# Patient Record
Sex: Female | Born: 1975 | Race: White | Hispanic: No | Smoking: Current every day smoker
Health system: Southern US, Academic
[De-identification: ages and names within clinical notes are randomized; demographics above are authoritative.]

## PROBLEM LIST (undated history)

## (undated) DIAGNOSIS — G43909 Migraine, unspecified, not intractable, without status migrainosus: Secondary | ICD-10-CM

## (undated) DIAGNOSIS — G4700 Insomnia, unspecified: Secondary | ICD-10-CM

## (undated) DIAGNOSIS — K219 Gastro-esophageal reflux disease without esophagitis: Secondary | ICD-10-CM

## (undated) HISTORY — DX: Insomnia, unspecified: G47.00

## (undated) HISTORY — DX: Gastro-esophageal reflux disease without esophagitis: K21.9

## (undated) HISTORY — PX: HX TONSILLECTOMY: SHX27

## (undated) HISTORY — DX: Migraine, unspecified, not intractable, without status migrainosus: G43.909

---
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
## 2016-04-22 DIAGNOSIS — C73 Malignant neoplasm of thyroid gland: Secondary | ICD-10-CM

## 2016-04-22 HISTORY — DX: Malignant neoplasm of thyroid gland: C73

## 2016-04-22 HISTORY — PX: HX PARTIAL THYROIDECTOMY: SHX18

## 2017-04-22 HISTORY — PX: TOTAL THYROIDECTOMY: SHX2547

## 2018-04-22 HISTORY — PX: HX PARTIAL THYROIDECTOMY: SHX18

## 2020-05-10 IMAGING — MR MRI LUMBAR SPINE WITHOUT CONTRAST
6 series · 48 of 48 positions shown · IV contrast (gadolinium)
Comparison: None available.

﻿EXAM:  MRI LUMBAR SPINE WITHOUT CONTRAST
INDICATION: Lower back pain.
TECHNIQUE: Multiplanar multisequential MRI of the lumbar spine was performed without gadolinium contrast. A non conventional body coil was utilized due to patient's extremely large body habitus.

[Series 4: T2 · sagittal · 5.0mm · 1.00mm/px · 6 of 13 slices shown (1 of 3)]
[im 1/13]
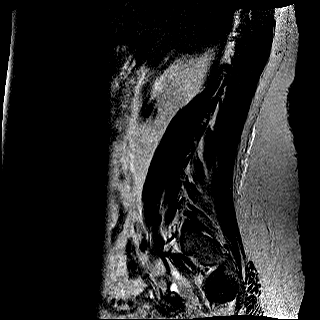
[im 3/13]
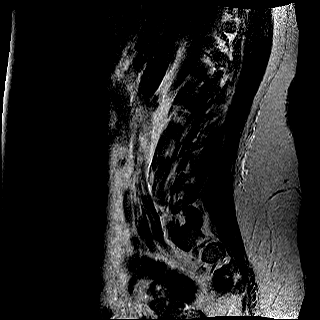
[im 5/13]
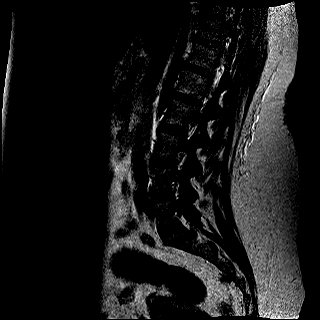
[im 8/13]
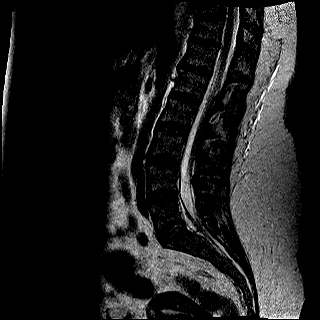
[im 10/13]
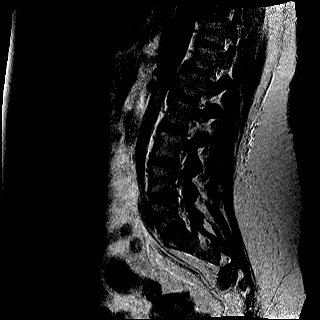
[im 13/13]
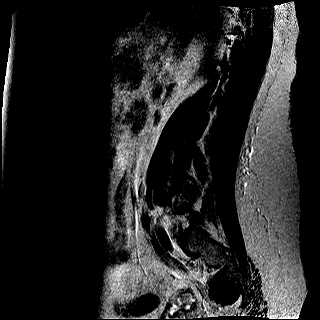

[Series 5: T1 · sagittal · 5.0mm · 1.00mm/px · 5 of 13 slices shown (1 of 2)]
[im 1/13]
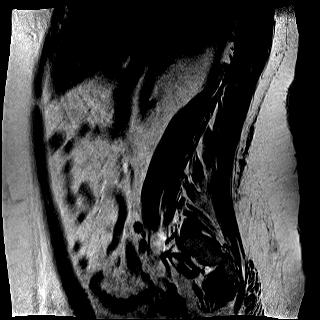
[im 4/13]
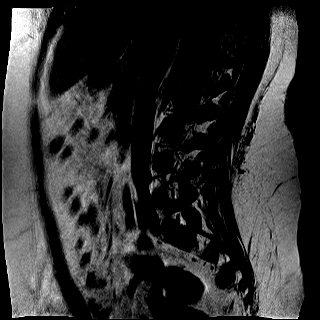
[im 7/13]
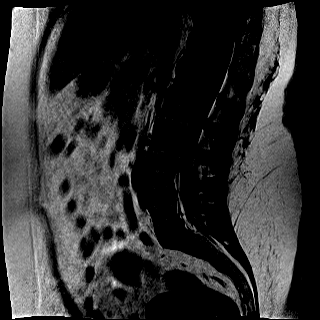
[im 10/13]
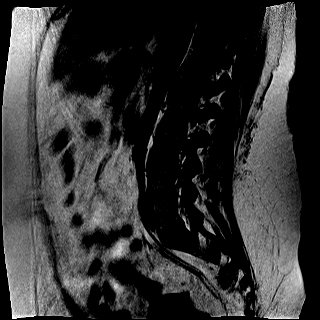
[im 13/13]
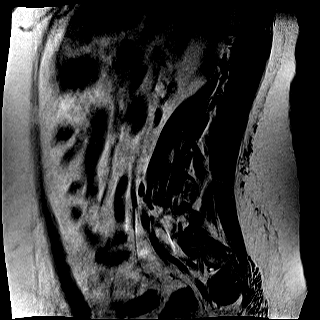

[Series 6: STIR · sagittal · 5.0mm · 1.25mm/px · 5 of 13 slices shown]
[im 1/13]
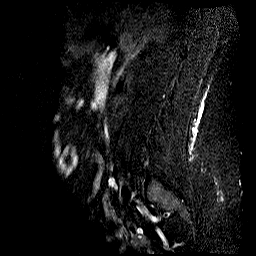
[im 4/13]
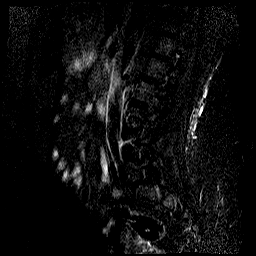
[im 7/13]
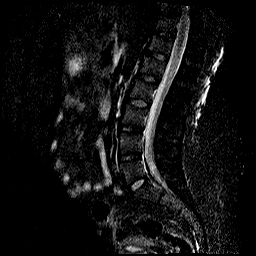
[im 10/13]
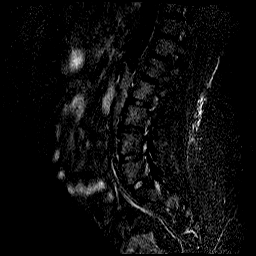
[im 13/13]
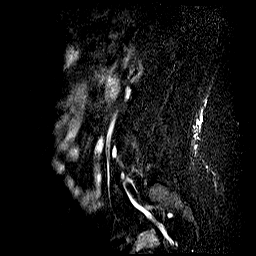

[Series 7: T2 · coronal · 4.0mm · 1.34mm/px · 8 of 20 slices shown (2 of 3)]
[im 1/20]
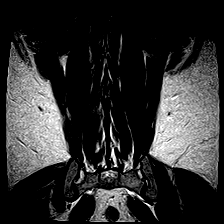
[im 3/20]
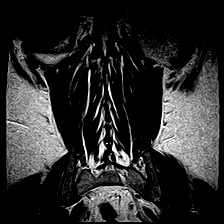
[im 6/20]
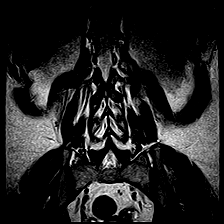
[im 9/20]
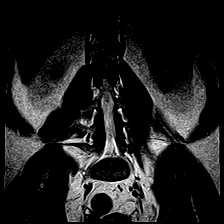
[im 11/20]
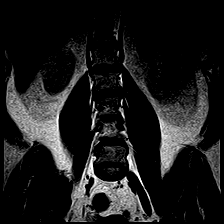
[im 14/20]
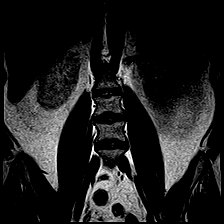
[im 17/20]
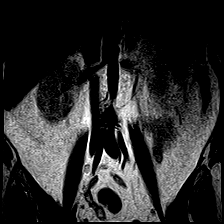
[im 20/20]
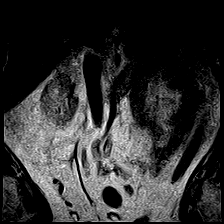

[Series 8: T2 · axial · 5.0mm · 0.89mm/px · z∈[-124,+133]mm · 12 of 30 slices shown (3 of 3)]
[im 1/30]
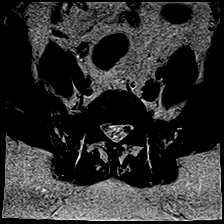
[im 3/30]
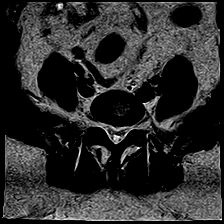
[im 6/30]
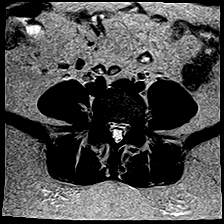
[im 8/30]
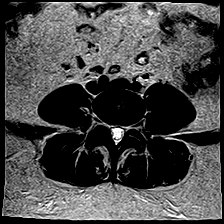
[im 11/30]
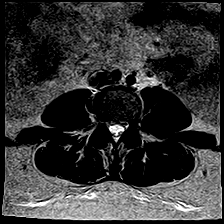
[im 14/30]
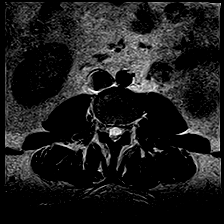
[im 16/30]
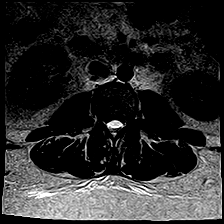
[im 19/30]
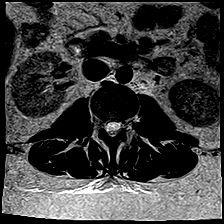
[im 22/30]
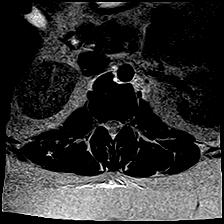
[im 24/30]
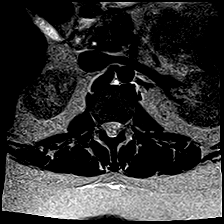
[im 27/30]
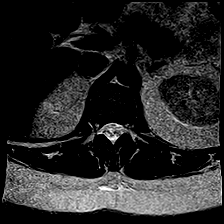
[im 30/30]
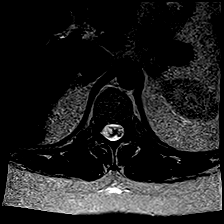

[Series 9: T1 · axial · 5.0mm · 0.89mm/px · z∈[-124,+133]mm · 12 of 30 slices shown (2 of 2)]
[im 1/30]
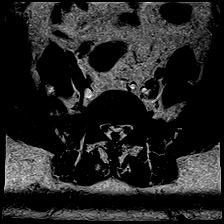
[im 3/30]
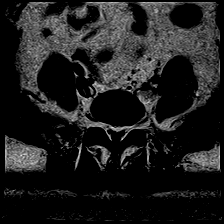
[im 6/30]
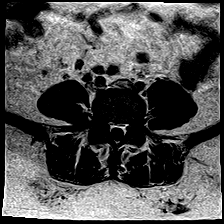
[im 8/30]
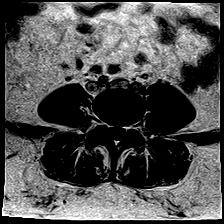
[im 11/30]
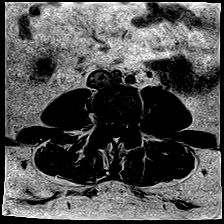
[im 14/30]
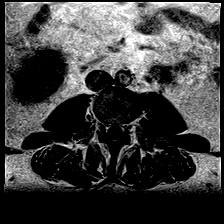
[im 16/30]
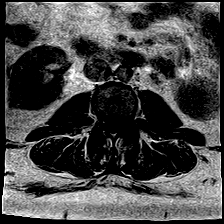
[im 19/30]
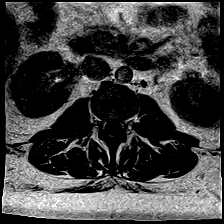
[im 22/30]
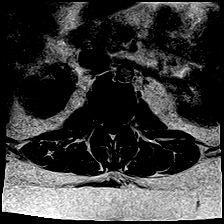
[im 24/30]
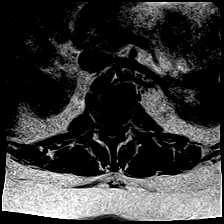
[im 27/30]
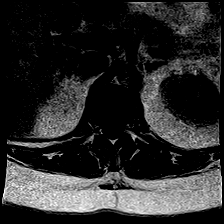
[im 30/30]
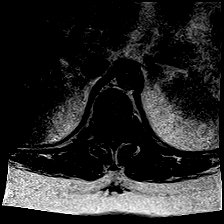

[48 of 48 positions shown; findings below may reference images not displayed]

FINDINGS: Vertebral bodies are normal in height, alignment and signal intensity. There is no acute fracture or subluxation. Distal spinal cord is also normal in signal intensity and terminates normally at T11-12 disc space level. Spinal canal is congenitally narrow.

There is no significant disc herniation, spinal canal or neural foraminal stenosis at any level.

Paraspinal soft tissues are also unremarkable.
IMPRESSION: Essentially unremarkable exam.

## 2020-10-30 IMAGING — MG 3D SCREENING MAMMO BIL W/CAD & TOMO
5 series · 8 of 15 positions shown · non-contrast
Comparison: None.

------------- REPORT [id number] -------------
﻿

TIGER, MUAYTHAI
DAYLEN AUNG,PA
EXAM:  3D BILATERAL ANNUAL SCREENING DIGITAL MAMMOGRAM WITH CAD AND TOMOSYNTHESIS
INDICATION: Screening.

[R]
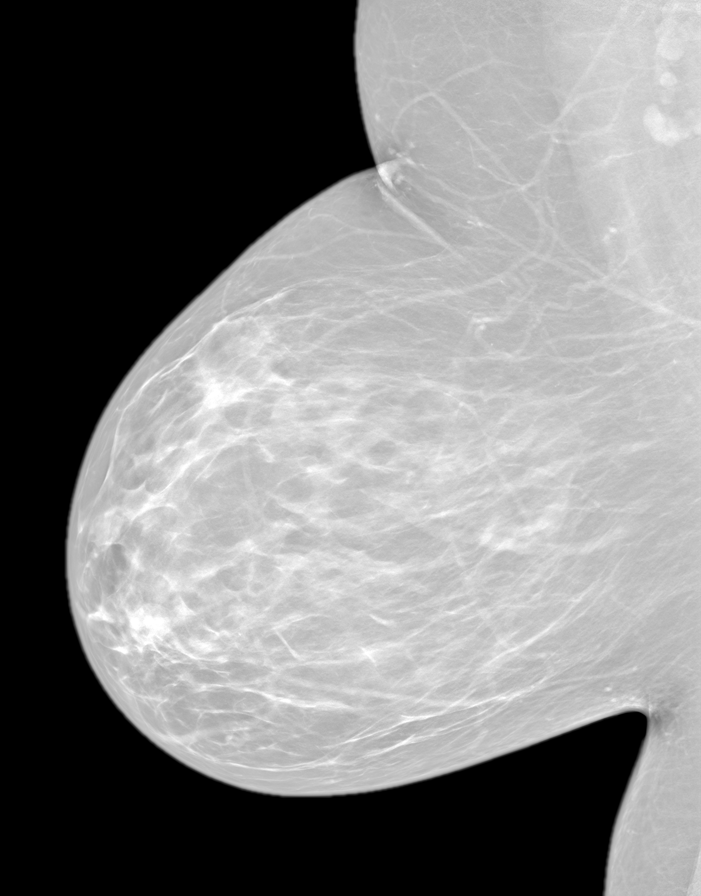

[L]
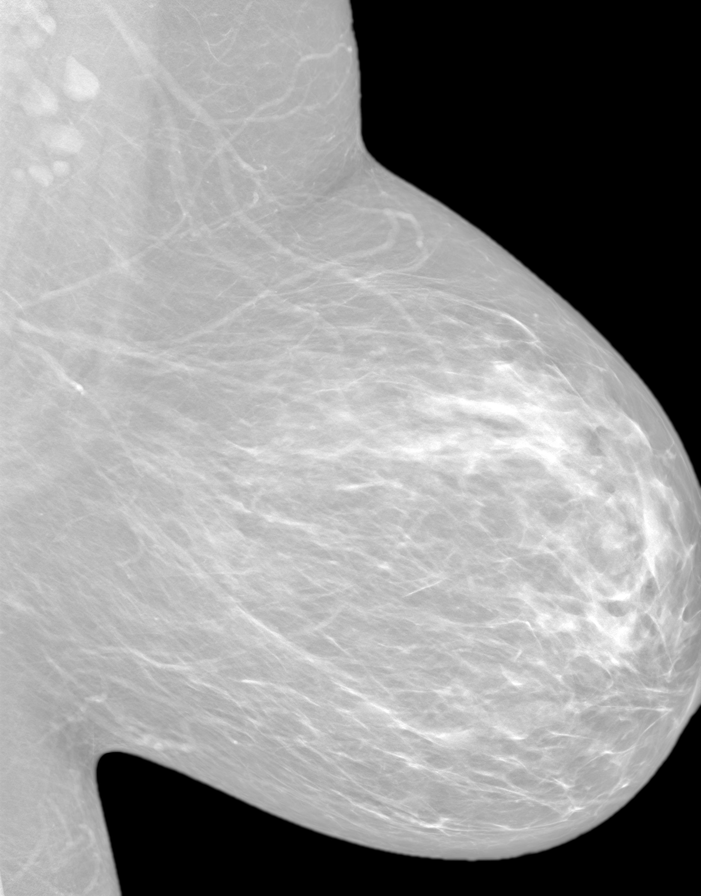

[R CC tomo · right · 0.10mm/px · 3 of 3 slices shown]
[im 1/3]
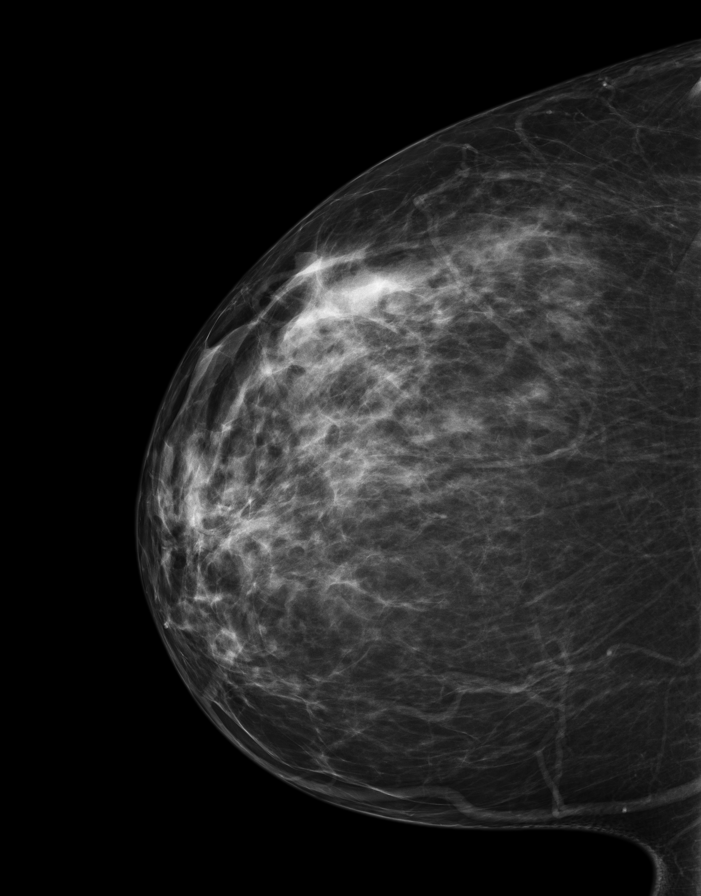
[im 2/3]
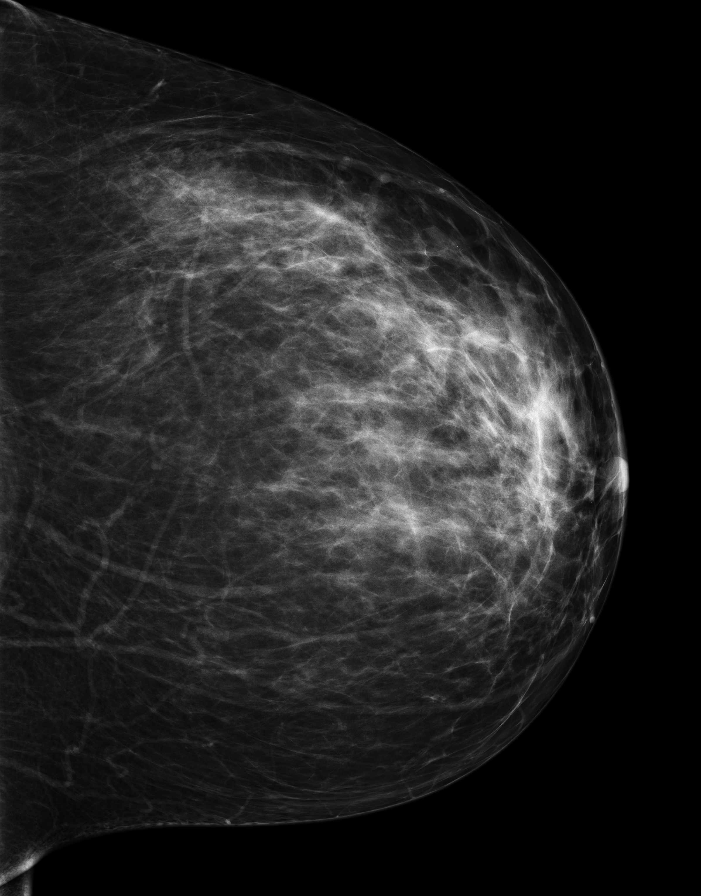
[im 3/3]
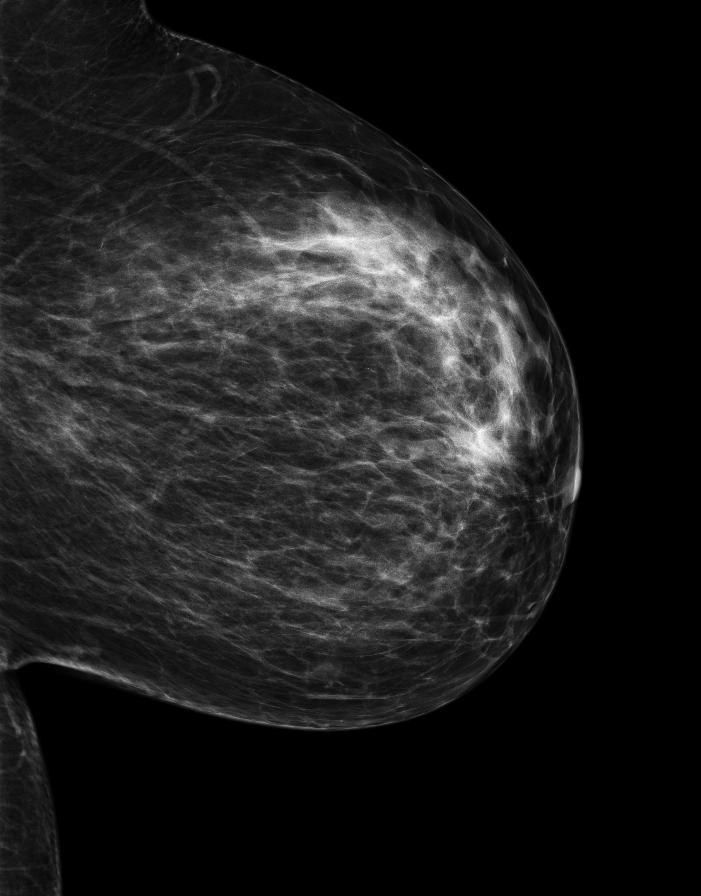

[3D SCREENING MAMMO BIL W/CAD & TOMO tomo · 2 of 17 frames shown (1 of 2)]
[frame 6/17]
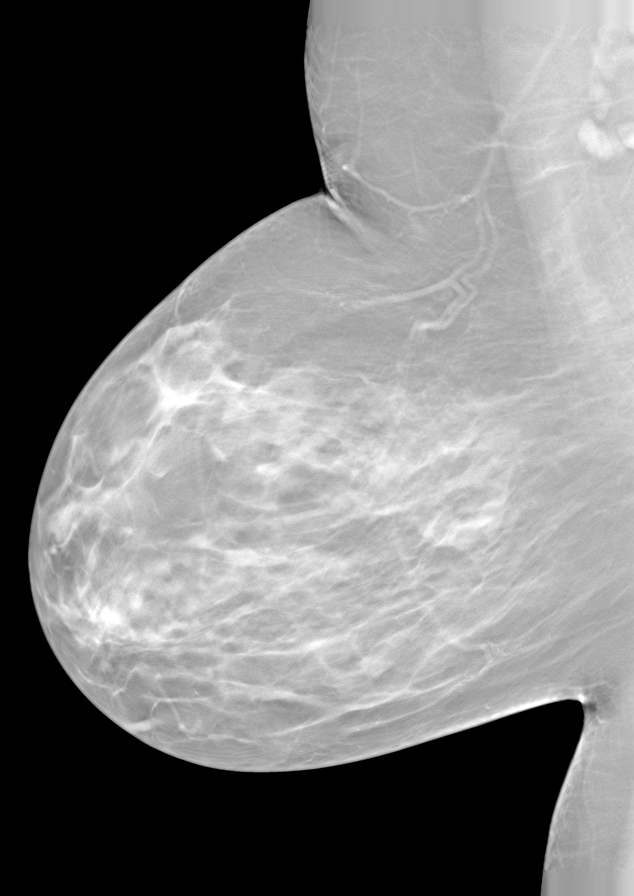
[frame 9/17]
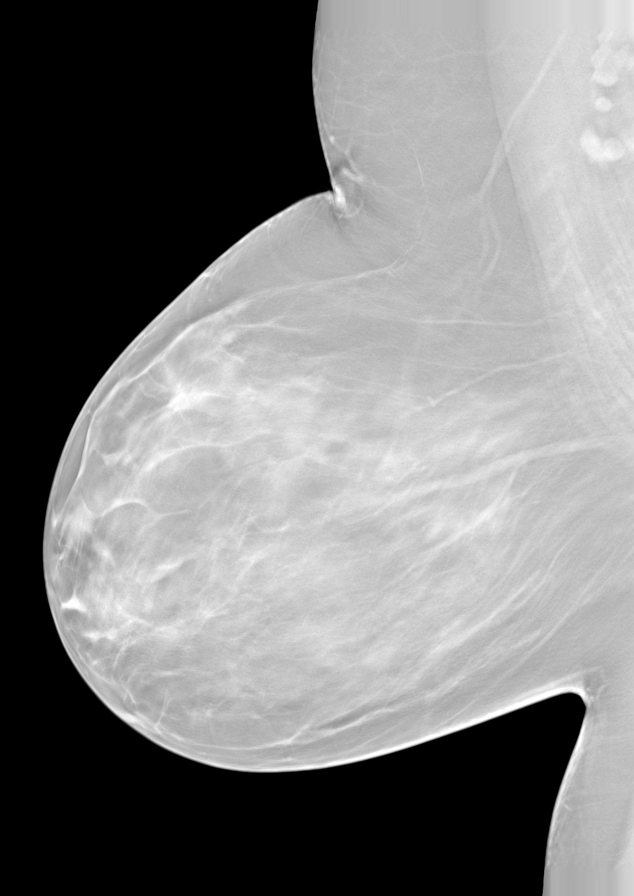

[3D SCREENING MAMMO BIL W/CAD & TOMO tomo (2 of 2) · tomo slice 11/20.0]
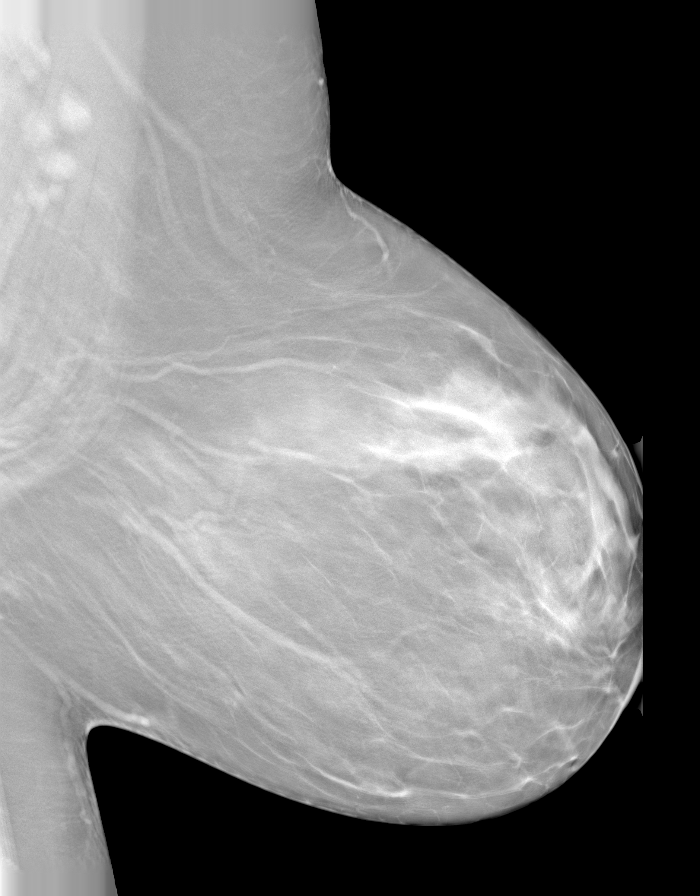

[8 of 15 positions shown; findings below may reference images not displayed]

FINDINGS: Breast parenchyma is heterogeneously dense.  There is no mass or suspicious cluster of microcalcifications.  There is no architectural distortion, skin thickening or nipple retraction.
IMPRESSION: 1.  BIRADS 0-Need additional imaging evaluation.  A comprehensive bilateral breast ultrasound is recommended due to heterogeneously dense breast parenchyma and lack of previous comparison mammograms. 

2.  DENSITY CODE – C (Heterogeneously dense). 

Final Assessment Code:

Bi-Rads 0

BI-RADS 0
 Need additional imaging evaluation.

BI-RADS 1
 Negative mammogram.

BI-RADS 2
 Benign finding.

BI-RADS 3
 Probably benign finding; short-interval follow-up suggested.

BI-RADS 4
 Suspicious abnormality; biopsy should be considered.

BI-RADS 5
 Highly suggestive of malignancy; appropriate action should be taken.

BI-RADS 6
 Known biopsy-proven malignancy; appropriate action should be taken.

NOTE:
In compliance with Federal regulations, the results of this mammogram are being sent to the patient.

------------- REPORT [id number] -------------
[facility]
[address]
[place] Ms.VALLADAREZ, YEIMY:
We wish to report the following on your recent mammography examination. We are sending a report to your referring physician or other health care provider. 
(       Abnormal: There is a finding on your mammogram that requires further tests for a more thorough evaluation. You should contact your referring physician as soon as possible (if you have not already done so).
This statement is mandated by the [place], Department of Health.
Your examination was performed by one of our technologists, who are registered radiological technologists and also specially certified in mammography:
___
Fatah, Abdalfatah (M)

Your mammogram was interpreted by our radiologist.

( 
Valentyna Groover, M.D.

(Annual Breast Examination by a physician or other health care provider
(Annual Mammography Screening beginning at age 40
(Monthly Breast Self Examination

## 2020-11-20 IMAGING — US US RT BREAST COMPLETE- 4 QUADRANTS PLUS AXILLA
1 series · 14 of 25 positions shown · non-contrast
Comparison: Mammogram dated 10/30/2020.

﻿EXAM:  26648   US RT BREAST COMPLETE- 4 QUADRANTS PLUS AXILLA,US LT BREAST COMPLETE- 4 QUADRANTS PLUS AXILLA
INDICATION: Call back for dense breast parenchyma.

[Series 1: us right breast complete- 4 quadrants plus axilla · 14 of 58 slices shown]
[im 1/58]
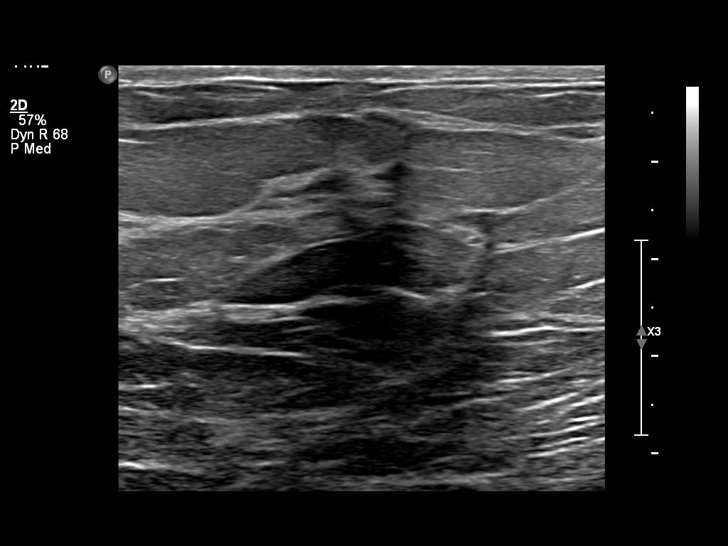
[im 5/58]
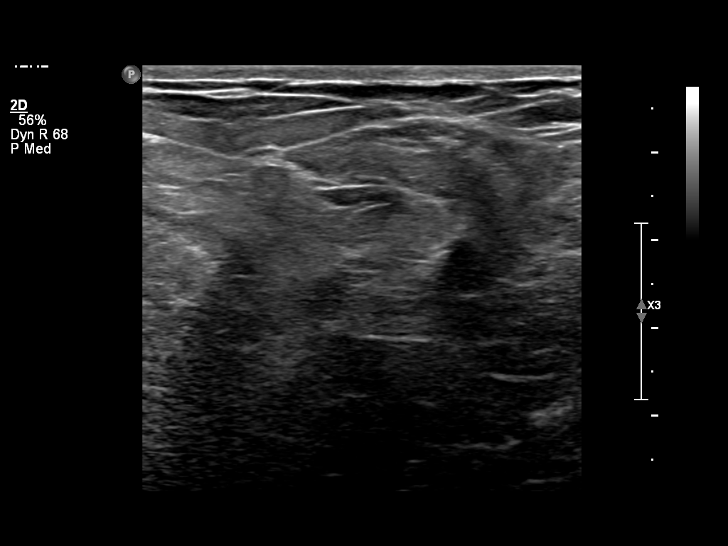
[im 10/58]
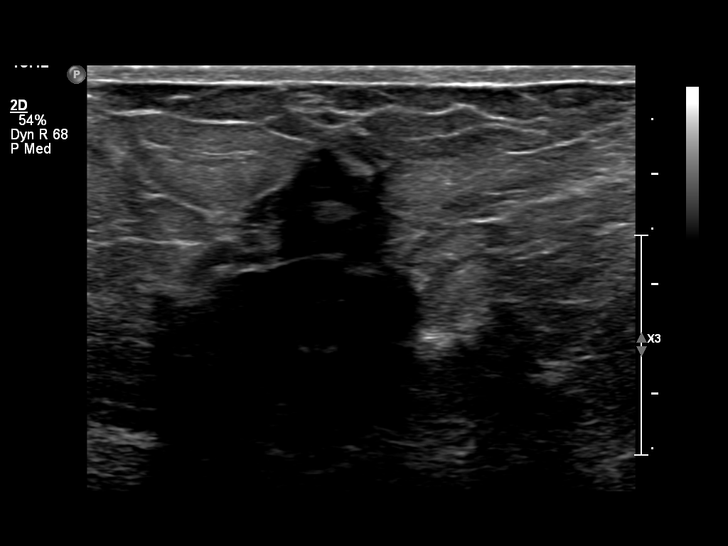
[im 15/58]
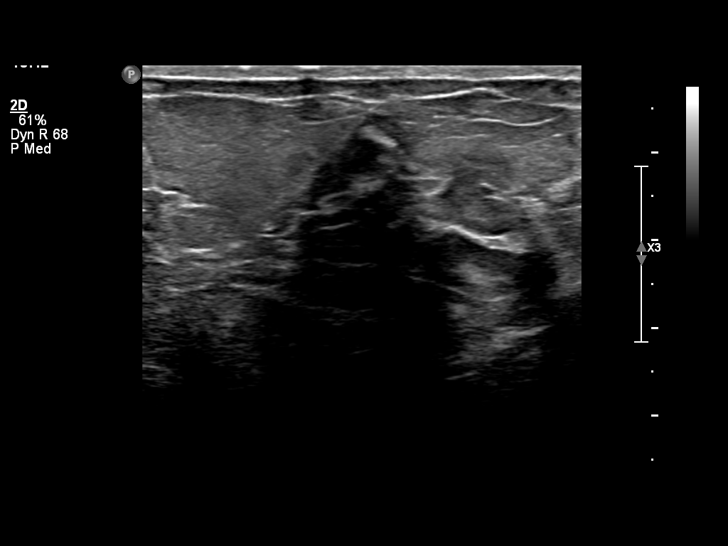
[im 20/58]
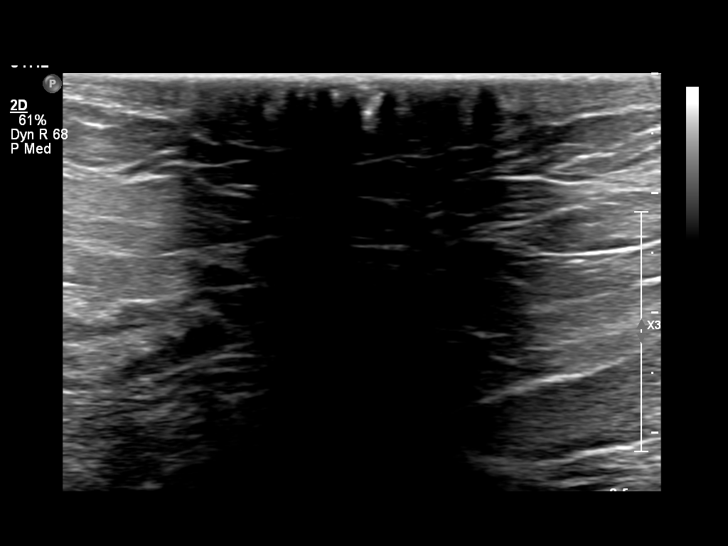
[im 22/58]
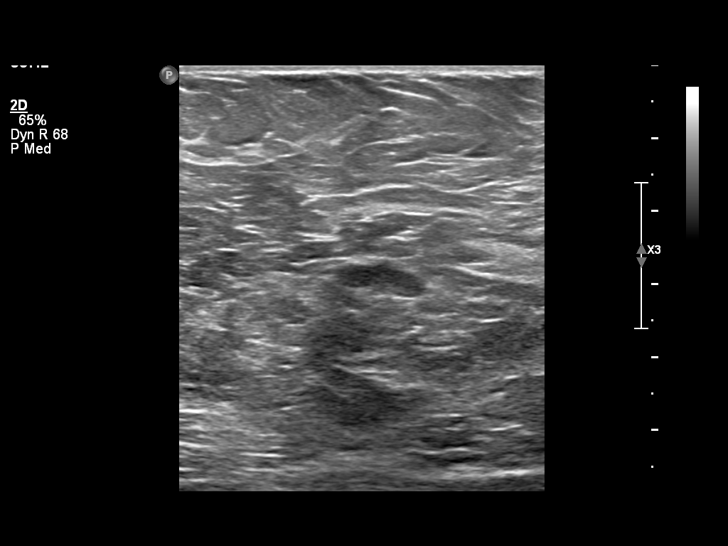
[im 27/58]
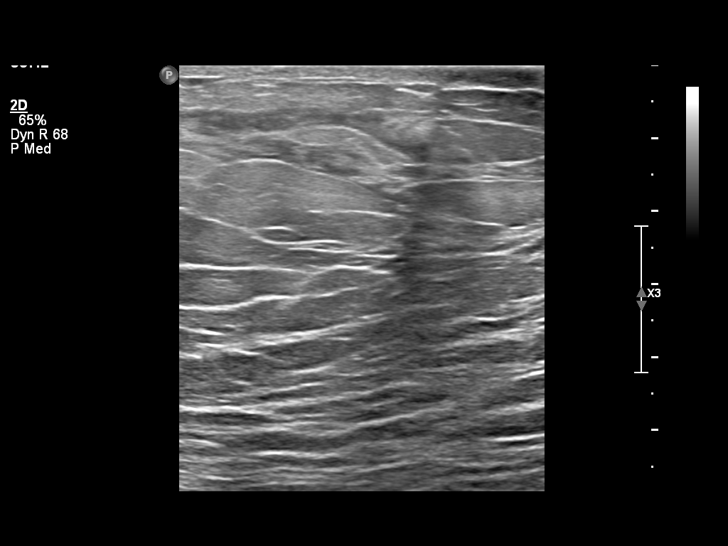
[im 31/58]
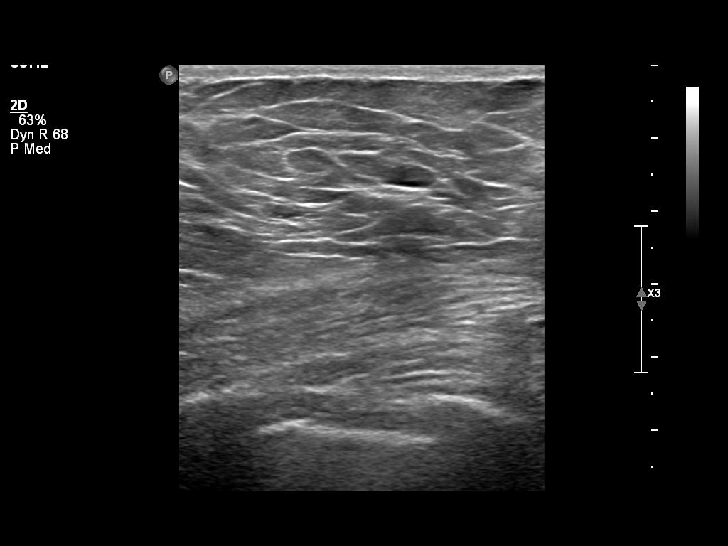
[im 36/58]
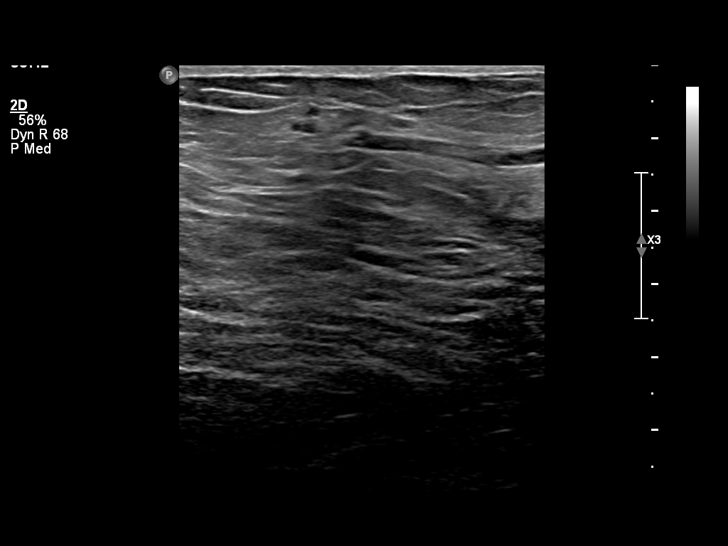
[im 39/58]
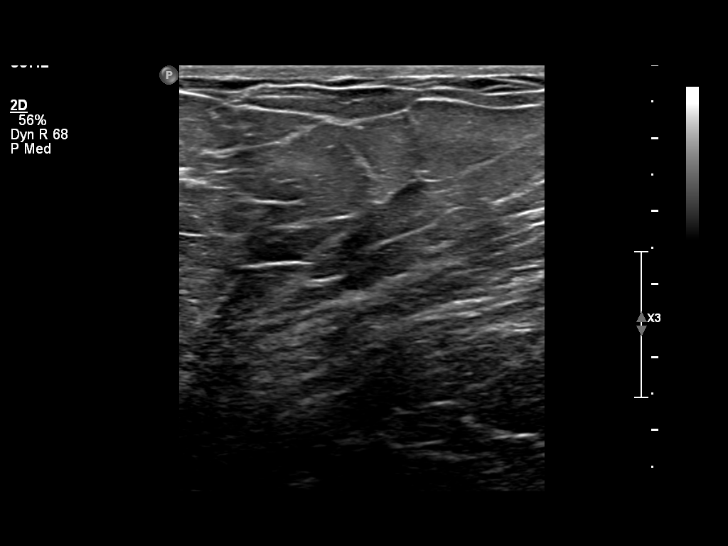
[im 43/58]
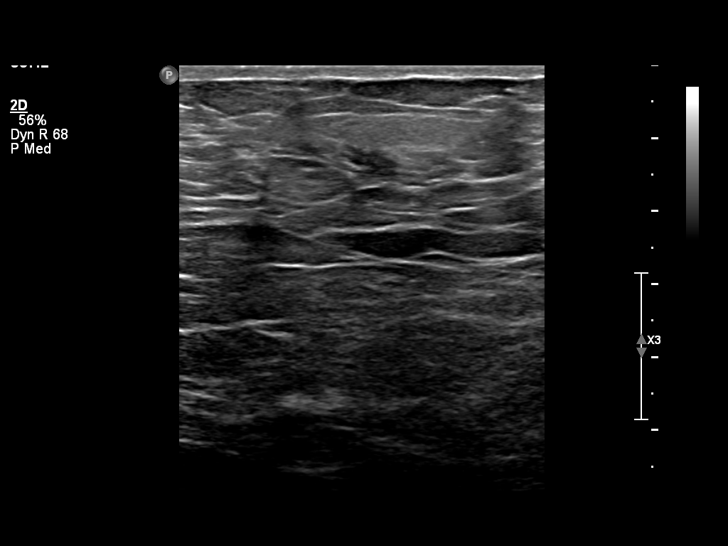
[im 48/58]
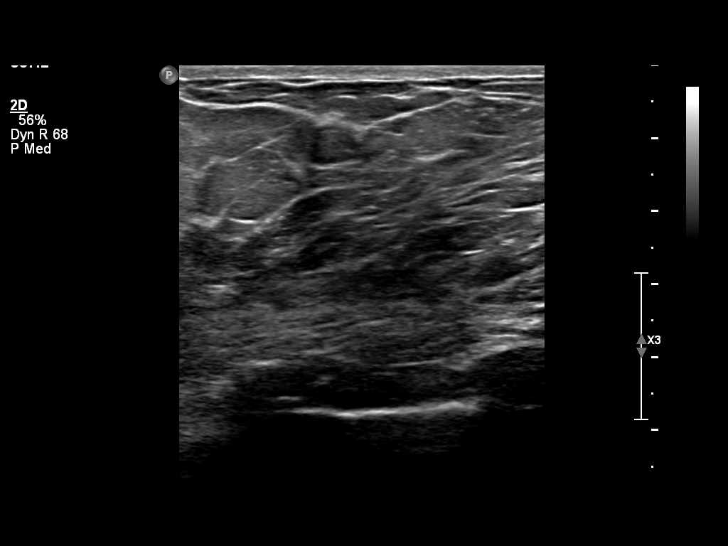
[im 53/58]
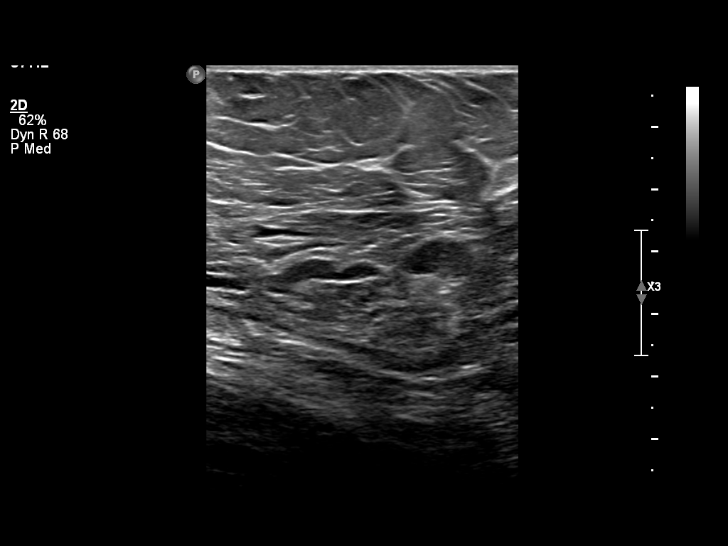
[im 58/58]
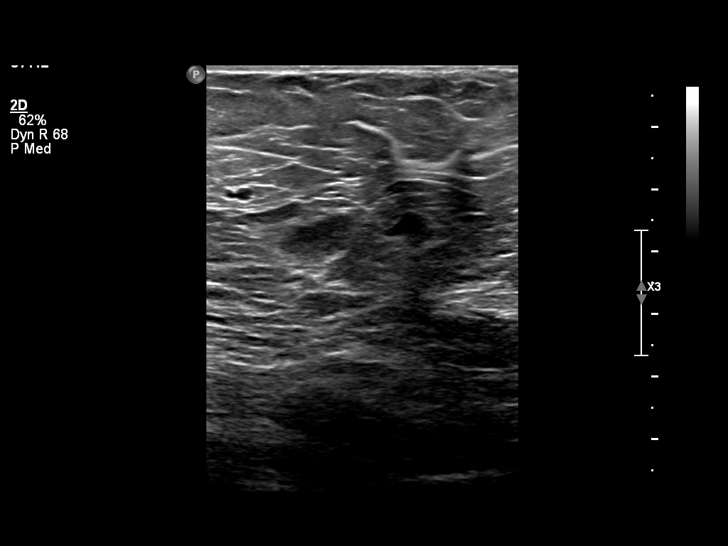

[14 of 25 positions shown; findings below may reference images not displayed]

FINDINGS: A comprehensive sonographic evaluation of both breasts was performed and representative images of all four quadrants, retroareolar and axillary regions were obtained.  There is no suspicious solid or cystic mass.  No ductal dilatation, architectural distortion or skin thickening is seen.  There is no axillary adenopathy on either side.
IMPRESSION: BIRAD 2-Benign findings.  Patient has been added in a reminder system with a target date for the next screening mammography.

## 2021-06-11 DIAGNOSIS — G4700 Insomnia, unspecified: Secondary | ICD-10-CM | POA: Insufficient documentation

## 2021-07-14 ENCOUNTER — Other Ambulatory Visit (RURAL_HEALTH_CENTER): Payer: Self-pay | Admitting: Family

## 2021-07-14 DIAGNOSIS — G4700 Insomnia, unspecified: Secondary | ICD-10-CM

## 2021-07-16 NOTE — Telephone Encounter (Signed)
Can you send for patient please since cathy not here to approve?/br

## 2021-07-20 ENCOUNTER — Other Ambulatory Visit (RURAL_HEALTH_CENTER): Payer: Self-pay | Admitting: Family

## 2021-07-20 DIAGNOSIS — G4700 Insomnia, unspecified: Secondary | ICD-10-CM

## 2021-07-23 MED ORDER — ZOLPIDEM 5 MG TABLET
5.0000 mg | ORAL_TABLET | Freq: Every day | ORAL | 0 refills | Status: DC
Start: 2021-07-23 — End: 2021-08-23

## 2021-07-24 DIAGNOSIS — R600 Localized edema: Secondary | ICD-10-CM

## 2021-07-24 HISTORY — DX: Localized edema: R60.0

## 2021-08-22 ENCOUNTER — Other Ambulatory Visit (RURAL_HEALTH_CENTER): Payer: Self-pay | Admitting: Family

## 2021-08-22 DIAGNOSIS — G4700 Insomnia, unspecified: Secondary | ICD-10-CM

## 2021-09-19 ENCOUNTER — Encounter (RURAL_HEALTH_CENTER): Payer: Self-pay | Admitting: Family

## 2021-09-19 ENCOUNTER — Ambulatory Visit: Payer: Managed Care, Other (non HMO) | Attending: Family | Admitting: Family

## 2021-09-19 ENCOUNTER — Other Ambulatory Visit (RURAL_HEALTH_CENTER): Payer: Self-pay | Admitting: Family

## 2021-09-19 ENCOUNTER — Other Ambulatory Visit: Payer: Self-pay

## 2021-09-19 ENCOUNTER — Ambulatory Visit (RURAL_HEALTH_CENTER): Payer: Managed Care, Other (non HMO) | Attending: Family | Admitting: Family

## 2021-09-19 VITALS — BP 124/86 | HR 73 | Temp 98.1°F | Resp 19 | Ht 65.0 in | Wt 290.5 lb

## 2021-09-19 DIAGNOSIS — F32A Depression, unspecified: Secondary | ICD-10-CM | POA: Insufficient documentation

## 2021-09-19 DIAGNOSIS — Z79899 Other long term (current) drug therapy: Secondary | ICD-10-CM | POA: Insufficient documentation

## 2021-09-19 DIAGNOSIS — Z0289 Encounter for other administrative examinations: Secondary | ICD-10-CM

## 2021-09-19 DIAGNOSIS — F17210 Nicotine dependence, cigarettes, uncomplicated: Secondary | ICD-10-CM | POA: Insufficient documentation

## 2021-09-19 DIAGNOSIS — E039 Hypothyroidism, unspecified: Secondary | ICD-10-CM | POA: Insufficient documentation

## 2021-09-19 DIAGNOSIS — K219 Gastro-esophageal reflux disease without esophagitis: Secondary | ICD-10-CM | POA: Insufficient documentation

## 2021-09-19 DIAGNOSIS — E559 Vitamin D deficiency, unspecified: Secondary | ICD-10-CM | POA: Insufficient documentation

## 2021-09-19 DIAGNOSIS — F418 Other specified anxiety disorders: Secondary | ICD-10-CM | POA: Insufficient documentation

## 2021-09-19 DIAGNOSIS — G4700 Insomnia, unspecified: Secondary | ICD-10-CM | POA: Insufficient documentation

## 2021-09-19 DIAGNOSIS — Z6841 Body Mass Index (BMI) 40.0 and over, adult: Secondary | ICD-10-CM | POA: Insufficient documentation

## 2021-09-19 DIAGNOSIS — E538 Deficiency of other specified B group vitamins: Secondary | ICD-10-CM | POA: Insufficient documentation

## 2021-09-19 LAB — LIPID PANEL
CHOL/HDL RATIO: 5.5
CHOLESTEROL: 176 mg/dL (ref <200)
HDL CHOL: 32 mg/dL (ref 23–92)
LDL CALC: 81 mg/dL (ref 0–100)
TRIGLYCERIDES: 313 mg/dL — ABNORMAL HIGH (ref <=150)
VLDL CALC: 63 mg/dL — ABNORMAL HIGH (ref 0–50)

## 2021-09-19 LAB — URINE DRUG SCREEN
AMPHET QL: NEGATIVE
BARB QL: NEGATIVE
BENZO QL: NEGATIVE
BUP QL: NEGATIVE
CANNAQL: NEGATIVE
COCQL: NEGATIVE
METHQL: NEGATIVE
OPIATE: NEGATIVE
OXYCODONE URINE: NEGATIVE
PCP QL: NEGATIVE
TRICYCLIC ANTIDEPRESSANTS URINE: NEGATIVE

## 2021-09-19 LAB — COMPREHENSIVE METABOLIC PNL, FASTING
ALBUMIN/GLOBULIN RATIO: 1.3 (ref 0.8–1.4)
ALBUMIN: 4.2 g/dL (ref 3.5–5.7)
ALKALINE PHOSPHATASE: 36 U/L (ref 34–104)
ALT (SGPT): 12 U/L (ref 7–52)
ANION GAP: 6 mmol/L — ABNORMAL LOW (ref 10–20)
AST (SGOT): 17 U/L (ref 13–39)
BILIRUBIN TOTAL: 0.4 mg/dL (ref 0.3–1.2)
BUN/CREA RATIO: 20 (ref 6–22)
BUN: 16 mg/dL (ref 7–25)
CALCIUM, CORRECTED: 9.7 mg/dL (ref 8.9–10.8)
CALCIUM: 9.9 mg/dL (ref 8.6–10.3)
CHLORIDE: 107 mmol/L (ref 98–107)
CO2 TOTAL: 26 mmol/L (ref 21–31)
CREATININE: 0.79 mg/dL (ref 0.60–1.30)
ESTIMATED GFR: 93 mL/min/{1.73_m2} (ref >59)
GLOBULIN: 3.2 (ref 2.9–5.4)
GLUCOSE: 87 mg/dL (ref 74–109)
OSMOLALITY, CALCULATED: 278 mOsm/kg (ref 270–290)
POTASSIUM: 3.9 mmol/L (ref 3.5–5.1)
PROTEIN TOTAL: 7.4 g/dL (ref 6.4–8.9)
SODIUM: 139 mmol/L (ref 136–145)

## 2021-09-19 LAB — THYROID STIMULATING HORMONE (SENSITIVE TSH): TSH: 0.377 u[IU]/mL — ABNORMAL LOW (ref 0.450–5.330)

## 2021-09-19 LAB — CBC
HCT: 44.3 % (ref 37.0–47.0)
HGB: 15.3 g/dL (ref 12.5–16.0)
MCH: 29.6 pg (ref 27.0–32.0)
MCHC: 34.5 g/dL (ref 32.0–36.0)
MCV: 85.8 fL (ref 78.0–99.0)
MPV: 11 fL — ABNORMAL HIGH (ref 7.4–10.4)
PLATELETS: 206 10*3/uL (ref 140–440)
RBC: 5.16 10*6/uL (ref 4.20–5.40)
RDW: 12.6 % (ref 11.6–14.8)
WBC: 9.9 10*3/uL (ref 4.0–10.5)
WBCS UNCORRECTED: 9.9 10*3/uL

## 2021-09-19 LAB — MAGNESIUM: MAGNESIUM: 2 mg/dL (ref 1.9–2.7)

## 2021-09-19 LAB — VITAMIN B12: VITAMIN B 12: >1500 pg/mL — ABNORMAL HIGH (ref 180–914)

## 2021-09-19 LAB — VITAMIN D 25 TOTAL: VITAMIN D: 92 ng/mL (ref 30–100)

## 2021-09-19 MED ORDER — CITALOPRAM 40 MG TABLET
40.0000 mg | ORAL_TABLET | Freq: Every day | ORAL | 1 refills | Status: DC
Start: 2021-09-19 — End: 2022-02-04

## 2021-09-19 MED ORDER — LEVOTHYROXINE 200 MCG TABLET
200.0000 ug | ORAL_TABLET | Freq: Every morning | ORAL | 1 refills | Status: DC
Start: 2021-09-19 — End: 2022-02-04

## 2021-09-19 MED ORDER — PANTOPRAZOLE 40 MG TABLET,DELAYED RELEASE
40.0000 mg | DELAYED_RELEASE_TABLET | Freq: Every day | ORAL | 1 refills | Status: AC
Start: 2021-09-19 — End: 2021-12-18

## 2021-09-19 MED ORDER — ZOLPIDEM 5 MG TABLET
5.0000 mg | ORAL_TABLET | Freq: Every day | ORAL | 1 refills | Status: DC
Start: 2021-09-19 — End: 2021-11-26

## 2021-09-19 NOTE — Progress Notes (Signed)
FAMILY MEDICINE, [place]  [address] [address]  Operated by [facility]     Name: Lindsey Daugherty MRN:  [id number]   Date of Birth: [birth year] Age: 46 y.o.   Date: 09/19/2021  Time: 11:40     Provider: Jodi Mourning, NP-C    Reason for visit: Follow Up 3 Months      History of Present Illness:  Lindsey Daugherty is a 46 y.o. female presenting with chronic disease management.    Patient Active Problem List    Diagnosis Date Noted   . Depression 09/19/2021   . Acquired hypothyroidism 09/19/2021   . Tobacco dependence due to cigarettes 09/19/2021   . Gastroesophageal reflux disease 09/19/2021   . Insomnia 09/19/2021   . Vitamin D deficiency 09/19/2021   . B12 deficiency 09/19/2021   . Morbid obesity with BMI of 45.0-49.9, adult (CMS [place]) 09/19/2021       Historical Data    Past Medical History:  Past Medical History:   Diagnosis Date   . Esophageal reflux    . Insomnia    . Migraine    . Thyroid cancer (CMS [facility]) 2018     Past Surgical History:  Past Surgical History:   Procedure Laterality Date   . HX PARTIAL THYROIDECTOMY Left 2018    thyroid cancer   . HX TONSILLECTOMY     . TOTAL THYROIDECTOMY Right 2019    No chemo/radiation     Allergies:  Allergies   Allergen Reactions   . Loratadine Shortness of Breath     claritan D   . Pseudoephedrine Shortness of Breath   . Bactrim [Sulfamethoxazole-Trimethoprim] Itching   . Vitamin E (D-Alpha Tocopherol) Itching     Medications:  Current Outpatient Medications   Medication Sig   . albuterol sulfate (PROVENTIL OR VENTOLIN OR PROAIR) 90 mcg/actuation Inhalation oral inhaler Take 1 Puff by inhalation   . citalopram (CELEXA) 40 mg Oral Tablet Take 1 Tablet (40 mg total) by mouth Once a day for 90 days   . drospirenone, contraceptive, (SLYND) 4 mg (28) Oral Tablet Take 1 Tablet (4 mg total) by mouth Once a day   . furosemide (LASIX) 20 mg Oral Tablet Take 1 Tablet (20 mg total) by mouth Once per day as  needed   . levothyroxine (SYNTHROID) 200 mcg Oral Tablet Take 1 Tablet (200 mcg total) by mouth Every morning for 90 days   . pantoprazole (PROTONIX) 40 mg Oral Tablet, Delayed Release (E.C.) Take 1 Tablet (40 mg total) by mouth Once a day for 90 days   . zolpidem (AMBIEN) 5 mg Oral Tablet Take 1 Tablet (5 mg total) by mouth Once a day for 30 days   . ZYRTEC 10 mg Oral Tablet Take 1 Tablet (10 mg total) by mouth     Family History:  Family Medical History:     Problem Relation (Age of Onset)    Diabetes type II Father    Elevated Lipids Father    Hypertension (High Blood Pressure) Father    No Known Problems Mother, Sister, Brother, Half-Sister, Half-Brother, Maternal Aunt, Maternal Uncle, Paternal 55, Paternal Uncle, Maternal Grandmother, Maternal Grandfather, Paternal 66, Paternal 28, Daughter, Son          Social History:  Social History     Socioeconomic History   . Marital status: Divorced   . Highest education level: Associate degree: academic program   Occupational History   .  Occupation: Pharmaceutical   Tobacco Use   . Smoking status: Every Day     Types: Cigarettes     Start date: 26   . Smokeless tobacco: Never   Vaping Use   . Vaping Use: Never used   Substance and Sexual Activity   . Alcohol use: Never   . Drug use: Never   . Sexual activity: Yes           Review of Systems:  Any pertinent Review of Systems as addressed in the HPI above.    Physical Exam:  Vital Signs:  Vitals:    09/19/21 1046   BP: 124/86   Pulse: 73   Resp: 19   Temp: 36.7 C (98.1 F)   SpO2: 99%   Weight: 132 kg (290 lb 8 oz)   Height: 1.651 m ('5\' 5"'$ )   BMI: 48.44     Physical Exam  Constitutional:       Appearance: Normal appearance. She is obese.   HENT:      Head: Normocephalic.      Right Ear: Tympanic membrane normal.      Left Ear: Tympanic membrane normal.      Nose: Nose normal.      Mouth/Throat:      Mouth: Mucous membranes are dry.      Pharynx: Oropharynx is clear.   Eyes:      Extraocular  Movements: Extraocular movements intact.      Conjunctiva/sclera: Conjunctivae normal.      Pupils: Pupils are equal, round, and reactive to light.   Cardiovascular:      Rate and Rhythm: Normal rate and regular rhythm.      Pulses: Normal pulses.      Heart sounds: Normal heart sounds.   Pulmonary:      Effort: Pulmonary effort is normal.      Breath sounds: Normal breath sounds.   Abdominal:      General: Bowel sounds are normal.      Palpations: Abdomen is soft.   Musculoskeletal:         General: Normal range of motion.      Cervical back: Normal range of motion and neck supple.   Skin:     General: Skin is warm and dry.   Neurological:      General: No focal deficit present.      Mental Status: She is alert and oriented to person, place, and time.   Psychiatric:         Mood and Affect: Mood normal.         Behavior: Behavior normal.         Thought Content: Thought content normal.         Judgment: Judgment normal.          Assessment/Plan:  (E03.9) Acquired hypothyroidism  (primary encounter diagnosis)  Plan: CBC, COMPREHENSIVE METABOLIC PNL, FASTING,         LIPID PANEL, MAGNESIUM, THYROID STIMULATING         HORMONE (SENSITIVE TSH), VITAMIN D 25 TOTAL,         VITAMIN B12, URINE DRUG SCREEN    (F32.A) Depression  Plan: CBC, COMPREHENSIVE METABOLIC PNL, FASTING,         LIPID PANEL, MAGNESIUM, THYROID STIMULATING         HORMONE (SENSITIVE TSH), VITAMIN D 25 TOTAL,         VITAMIN B12, URINE DRUG SCREEN    (K21.9) Gastroesophageal reflux disease, unspecified whether  esophagitis present  Plan: CBC, COMPREHENSIVE METABOLIC PNL, FASTING,         LIPID PANEL, MAGNESIUM, THYROID STIMULATING         HORMONE (SENSITIVE TSH), VITAMIN D 25 TOTAL,         VITAMIN B12, URINE DRUG SCREEN    (F17.210) Tobacco dependence due to cigarettes  Plan: CBC, COMPREHENSIVE METABOLIC PNL, FASTING,         LIPID PANEL, MAGNESIUM, THYROID STIMULATING         HORMONE (SENSITIVE TSH), VITAMIN D 25 TOTAL,         VITAMIN B12, URINE DRUG  SCREEN    (E66.01,  Z68.42) Morbid obesity with BMI of 45.0-49.9, adult (CMS HCC)  Plan: CBC, COMPREHENSIVE METABOLIC PNL, FASTING,         LIPID PANEL, MAGNESIUM, THYROID STIMULATING         HORMONE (SENSITIVE TSH), VITAMIN D 25 TOTAL,         VITAMIN B12, URINE DRUG SCREEN, Referral to         External Provider    (G47.00) Insomnia, unspecified type  Plan: CBC, COMPREHENSIVE METABOLIC PNL, FASTING,         LIPID PANEL, MAGNESIUM, THYROID STIMULATING         HORMONE (SENSITIVE TSH), VITAMIN D 25 TOTAL,         VITAMIN B12, URINE DRUG SCREEN, zolpidem         (AMBIEN) 5 mg Oral Tablet    (E55.9) Vitamin D deficiency  Plan: CBC, COMPREHENSIVE METABOLIC PNL, FASTING,         LIPID PANEL, MAGNESIUM, THYROID STIMULATING         HORMONE (SENSITIVE TSH), VITAMIN D 25 TOTAL,         VITAMIN B12, URINE DRUG SCREEN    (E53.8) B12 deficiency  Plan: CBC, COMPREHENSIVE METABOLIC PNL, FASTING,         LIPID PANEL, MAGNESIUM, THYROID STIMULATING         HORMONE (SENSITIVE TSH), VITAMIN D 25 TOTAL,         VITAMIN B12, URINE DRUG SCREEN    (J19.417) Medication management  Plan: URINE DRUG SCREEN    (Z02.89) Medication management contract agreement  Plan: URINE DRUG SCREEN      Morbid Obesity: Referral  to [facility] for bariatric surgery evaluation.      Labs drawn today.  Continue current medications.  Advised a low-fat/low sodium diet, advised 150 minutes of scheduled weekly physical activity as tolerated.  Advised to maintain a healthy weight.      Return in about 3 months (around 12/20/2021) for routine visist; schedule adult wellness visit.    Jodi Mourning, NP-C     Portions of this note may be dictated using voice recognition software or a dictation service. Variances in spelling and vocabulary are possible and unintentional. Not all errors are caught/corrected. Please notify the [facility] if any discrepancies are noted or if the meaning of any statement is not clear.

## 2021-09-19 NOTE — Nursing Note (Signed)
Patient is here for 3 month follow up with medication refills and no new concerns.

## 2021-09-19 NOTE — Addendum Note (Signed)
Addended by: Carmin Muskrat A on: 09/19/2021 04:01 PM     Modules accepted: Orders

## 2021-09-24 ENCOUNTER — Encounter (RURAL_HEALTH_CENTER): Payer: Self-pay | Admitting: Family

## 2021-09-24 LAB — NOTES AND COMMENTS

## 2021-09-24 LAB — QUEST MISC ORDER - AMBIENT

## 2021-10-24 ENCOUNTER — Other Ambulatory Visit (RURAL_HEALTH_CENTER): Payer: Self-pay | Admitting: Family

## 2021-11-23 ENCOUNTER — Other Ambulatory Visit (RURAL_HEALTH_CENTER): Payer: Self-pay | Admitting: Family

## 2021-11-23 DIAGNOSIS — G4700 Insomnia, unspecified: Secondary | ICD-10-CM

## 2021-12-19 ENCOUNTER — Ambulatory Visit (RURAL_HEALTH_CENTER): Payer: Self-pay | Admitting: Family

## 2021-12-31 ENCOUNTER — Ambulatory Visit (RURAL_HEALTH_CENTER): Payer: Self-pay | Admitting: Family

## 2022-01-02 ENCOUNTER — Other Ambulatory Visit (RURAL_HEALTH_CENTER): Payer: Self-pay | Admitting: Family

## 2022-01-02 DIAGNOSIS — J069 Acute upper respiratory infection, unspecified: Secondary | ICD-10-CM

## 2022-02-04 ENCOUNTER — Ambulatory Visit (RURAL_HEALTH_CENTER): Payer: Managed Care, Other (non HMO) | Attending: Family | Admitting: Family

## 2022-02-04 ENCOUNTER — Telehealth (RURAL_HEALTH_CENTER): Payer: Self-pay | Admitting: Family

## 2022-02-04 ENCOUNTER — Ambulatory Visit: Payer: Managed Care, Other (non HMO) | Attending: Family | Admitting: Family

## 2022-02-04 ENCOUNTER — Other Ambulatory Visit: Payer: Self-pay

## 2022-02-04 ENCOUNTER — Encounter (RURAL_HEALTH_CENTER): Payer: Self-pay | Admitting: Family

## 2022-02-04 VITALS — BP 131/70 | HR 85 | Temp 98.0°F | Resp 18 | Ht 65.0 in | Wt 290.4 lb

## 2022-02-04 DIAGNOSIS — F324 Major depressive disorder, single episode, in partial remission: Secondary | ICD-10-CM | POA: Insufficient documentation

## 2022-02-04 DIAGNOSIS — E559 Vitamin D deficiency, unspecified: Secondary | ICD-10-CM | POA: Insufficient documentation

## 2022-02-04 DIAGNOSIS — E538 Deficiency of other specified B group vitamins: Secondary | ICD-10-CM | POA: Insufficient documentation

## 2022-02-04 DIAGNOSIS — K219 Gastro-esophageal reflux disease without esophagitis: Secondary | ICD-10-CM | POA: Insufficient documentation

## 2022-02-04 DIAGNOSIS — Z6841 Body Mass Index (BMI) 40.0 and over, adult: Secondary | ICD-10-CM | POA: Insufficient documentation

## 2022-02-04 DIAGNOSIS — F17210 Nicotine dependence, cigarettes, uncomplicated: Secondary | ICD-10-CM | POA: Insufficient documentation

## 2022-02-04 DIAGNOSIS — E039 Hypothyroidism, unspecified: Secondary | ICD-10-CM | POA: Insufficient documentation

## 2022-02-04 DIAGNOSIS — Z131 Encounter for screening for diabetes mellitus: Secondary | ICD-10-CM | POA: Insufficient documentation

## 2022-02-04 DIAGNOSIS — Z23 Encounter for immunization: Secondary | ICD-10-CM | POA: Insufficient documentation

## 2022-02-04 DIAGNOSIS — F5101 Primary insomnia: Secondary | ICD-10-CM | POA: Insufficient documentation

## 2022-02-04 MED ORDER — CITALOPRAM 40 MG TABLET
40.0000 mg | ORAL_TABLET | Freq: Every day | ORAL | 1 refills | Status: DC
Start: 2022-02-04 — End: 2022-05-08

## 2022-02-04 MED ORDER — ZOLPIDEM 5 MG TABLET
5.0000 mg | ORAL_TABLET | Freq: Every day | ORAL | 2 refills | Status: AC
Start: 2022-02-04 — End: 2022-03-06

## 2022-02-04 MED ORDER — LEVOTHYROXINE 200 MCG TABLET
200.0000 ug | ORAL_TABLET | Freq: Every morning | ORAL | 1 refills | Status: DC
Start: 2022-02-04 — End: 2022-05-08

## 2022-02-04 MED ORDER — PANTOPRAZOLE 40 MG TABLET,DELAYED RELEASE
40.0000 mg | DELAYED_RELEASE_TABLET | Freq: Every day | ORAL | 1 refills | Status: DC
Start: 2022-02-04 — End: 2022-05-08

## 2022-02-04 NOTE — Progress Notes (Signed)
FAMILY MEDICINE, [place]  [address] [address]  Operated by [facility]     Name: Lindsey Daugherty MRN:  [id number]   Date of Birth: [birth year] Age: 46 y.o.   Date: 02/04/2022  Time: 16:19     Provider: Jodi Mourning, NP-C    Reason for visit: Follow Up 3 Months (Patient needs thyroid checked.)      History of Present Illness:  Lindsey Daugherty is a 46 y.o. female presenting with chronic disease management.    Patient Active Problem List    Diagnosis Date Noted    Immunization due 02/04/2022    Diabetes mellitus screening 02/04/2022    Depression 09/19/2021    Acquired hypothyroidism 09/19/2021    Tobacco dependence due to cigarettes 09/19/2021    Gastroesophageal reflux disease 09/19/2021    Insomnia 09/19/2021    Vitamin D deficiency 09/19/2021    B12 deficiency 09/19/2021    Morbid obesity with BMI of 45.0-49.9, adult (CMS [place]) 09/19/2021       Historical Data    Past Medical History:  Past Medical History:   Diagnosis Date    Esophageal reflux     Insomnia     Migraine     Thyroid cancer (CMS [place]) 2018     Past Surgical History:  Past Surgical History:   Procedure Laterality Date    HX PARTIAL THYROIDECTOMY Right 2018    No chemo/radiation; dx thyroid cancer    HX PARTIAL THYROIDECTOMY Left 2020    no chemo/radiation    HX TONSILLECTOMY  1981     Allergies:  Allergies   Allergen Reactions    Loratadine Shortness of Breath     claritan D    Pseudoephedrine Shortness of Breath    Bactrim [Sulfamethoxazole-Trimethoprim] Itching    Vitamin E (D-Alpha Tocopherol) Itching     Medications:  Current Outpatient Medications   Medication Sig    albuterol sulfate (PROVENTIL OR VENTOLIN OR PROAIR) 90 mcg/actuation Inhalation oral inhaler Take 1 Puff by inhalation    ascorbic acid (SUPER C PO) Take 1 Tablet by mouth Once a day Super c with zinc    citalopram (CELEXA) 40 mg Oral Tablet Take 1 Tablet (40 mg total) by mouth Once a day for 90 days     drospirenone, contraceptive, (SLYND) 4 mg (28) Oral Tablet Take 1 Tablet (4 mg total) by mouth Once a day    furosemide (LASIX) 20 mg Oral Tablet Take 1 Tablet (20 mg total) by mouth Once per day as needed    levothyroxine (SYNTHROID) 200 mcg Oral Tablet Take 1 Tablet (200 mcg total) by mouth Every morning for 90 days    pantoprazole (PROTONIX) 40 mg Oral Tablet, Delayed Release (E.C.) Take 1 Tablet (40 mg total) by mouth Once a day for 90 days    prenatal 68/TLXB fu/folic acid (PRENATAL COMPLETE ORAL) Take 1 Tablet by mouth Once a day    valACYclovir (VALTREX) 500 mg Oral Tablet Take 1 Tablet (500 mg total) by mouth Once a day    zolpidem (AMBIEN) 5 mg Oral Tablet Take 1 Tablet (5 mg total) by mouth Once a day for 30 days    ZYRTEC 10 mg Oral Tablet Take 1 Tablet (10 mg total) by mouth     Family History:  Family Medical History:       Problem Relation (Age of Onset)    Diabetes type II Father    Elevated  Lipids Father    Hypertension (High Blood Pressure) Father    No Known Problems Mother, Sister, Brother, Half-Sister, Half-Brother, Maternal 60, Maternal Uncle, Paternal 71, Paternal Uncle, Maternal Grandmother, Maternal Grandfather, Paternal Grandmother, Paternal Grandfather, Daughter, Son            Social History:  Social History     Socioeconomic History    Marital status: Divorced    Number of children: 0    Highest education level: Associate degree: academic program   Occupational History    Occupation: Pharmaceutical   Tobacco Use    Smoking status: Every Day     Packs/day: 1     Types: Cigarettes     Start date: 1995    Smokeless tobacco: Never   Brewing technologist Use: Never used   Substance and Sexual Activity    Alcohol use: Never    Drug use: Never    Sexual activity: Yes     Partners: Male     Comment: LMP: 12/04/21 ON ORAL CONTRACEPTION; G2P0A2     Social Determinants of Health     Financial Resource Strain: Low Risk  (02/04/2022)    Financial Resource Strain     SDOH Financial: No    Transportation Needs: Low Risk  (02/04/2022)    Transportation Needs     SDOH Transportation: No   Social Connections: Low Risk  (02/04/2022)    Social Connections     SDOH Social Isolation: 5 or more times a week   Intimate Partner Violence: [place]  (02/04/2022)    Intimate Partner Violence     SDOH Domestic Violence: I have not had a partner in the past year.   Housing Stability: Low Risk  (02/04/2022)    Housing Stability     SDOH Housing Situation: I have housing.     SDOH Housing Worry: No           Review of Systems:  Any pertinent Review of Systems as addressed in the HPI above.    Physical Exam:  Vital Signs:  Vitals:    02/04/22 1455   BP: 131/70   Pulse: 85   Resp: 18   Temp: 36.7 C (98 F)   SpO2: 99%   Weight: 132 kg (290 lb 6.4 oz)   Height: 1.651 m ('5\' 5"'$ )   BMI: 48.43     Physical Exam  Constitutional:       Appearance: Normal appearance. She is obese.   HENT:      Head: Normocephalic.      Right Ear: Tympanic membrane normal.      Left Ear: Tympanic membrane normal.      Nose: Nose normal.      Mouth/Throat:      Pharynx: Oropharynx is clear.   Eyes:      Extraocular Movements: Extraocular movements intact.      Conjunctiva/sclera: Conjunctivae normal.      Pupils: Pupils are equal, round, and reactive to light.   Neck:      Thyroid: No thyroid mass, thyromegaly or thyroid tenderness.   Cardiovascular:      Rate and Rhythm: Normal rate and regular rhythm.      Pulses: Normal pulses.      Heart sounds: Normal heart sounds, S1 normal and S2 normal.   Pulmonary:      Effort: Pulmonary effort is normal.      Breath sounds: Normal breath sounds.   Abdominal:  General: Bowel sounds are normal.      Palpations: Abdomen is soft.   Musculoskeletal:         General: Normal range of motion.      Cervical back: Normal range of motion and neck supple.      Right lower leg: No edema.      Left lower leg: No edema.   Skin:     General: Skin is warm and dry.   Neurological:      General: No focal deficit  present.      Mental Status: She is alert and oriented to person, place, and time.      Motor: Motor function is intact.      Coordination: Coordination is intact.      Gait: Gait is intact.   Psychiatric:         Mood and Affect: Mood normal.         Behavior: Behavior normal. Behavior is cooperative.         Thought Content: Thought content normal.         Cognition and Memory: Cognition normal.         Judgment: Judgment normal.          Assessment/Plan:  (E03.9) Acquired hypothyroidism  (primary encounter diagnosis)  Plan: CBC, COMPREHENSIVE METABOLIC PNL, FASTING,         LIPID PANEL, MAGNESIUM, THYROID STIMULATING         HORMONE (SENSITIVE TSH), VITAMIN D 25 TOTAL,         VITAMIN B12    (K21.9) Gastroesophageal reflux disease  Plan: CBC, COMPREHENSIVE METABOLIC PNL, FASTING,         LIPID PANEL, MAGNESIUM, THYROID STIMULATING         HORMONE (SENSITIVE TSH), VITAMIN D 25 TOTAL,         VITAMIN B12    (E53.8) B12 deficiency  Plan: CBC, COMPREHENSIVE METABOLIC PNL, FASTING,         LIPID PANEL, MAGNESIUM, THYROID STIMULATING         HORMONE (SENSITIVE TSH), VITAMIN D 25 TOTAL,         VITAMIN B12    (E55.9) Vitamin D deficiency  Plan: CBC, COMPREHENSIVE METABOLIC PNL, FASTING,         LIPID PANEL, MAGNESIUM, THYROID STIMULATING         HORMONE (SENSITIVE TSH), VITAMIN D 25 TOTAL,         VITAMIN B12    (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, unspecified whether recurrent (CMS HCC)  Plan: CBC, COMPREHENSIVE METABOLIC PNL, FASTING,         LIPID PANEL, MAGNESIUM, THYROID STIMULATING         HORMONE (SENSITIVE TSH), VITAMIN D 25 TOTAL,         VITAMIN B12    (F17.210) Tobacco dependence due to cigarettes  Plan: CBC, COMPREHENSIVE METABOLIC PNL, FASTING,         LIPID PANEL, MAGNESIUM, THYROID STIMULATING         HORMONE (SENSITIVE TSH), VITAMIN D 25 TOTAL,         VITAMIN B12, Refer to [facility] Smoking Cessation    (E66.01,  Z68.42) Morbid obesity with BMI of 45.0-49.9, adult (CMS HCC)  Comment: [facility]  [facility].  Plan: CBC, COMPREHENSIVE METABOLIC PNL, FASTING,         LIPID PANEL, MAGNESIUM, THYROID STIMULATING         HORMONE (SENSITIVE TSH), VITAMIN D  25 TOTAL,         VITAMIN B12    (Z13.1) Diabetes mellitus screening  Plan: HGA1C (HEMOGLOBIN A1C WITH EST AVG GLUCOSE)    (F51.01) Primary insomnia  Plan: CBC, COMPREHENSIVE METABOLIC PNL, FASTING,         LIPID PANEL, MAGNESIUM, THYROID STIMULATING         HORMONE (SENSITIVE TSH), VITAMIN D 25 TOTAL,         VITAMIN B12    (Z23) Immunization due  Comment: Flu vaccine given today.     Labs drawn today.  Continue current medications.  Advised a low-fat/low sodium diet, advised 150 minutes of scheduled weekly physical activity as tolerated.  Advised to maintain a healthy weight.     Return in about 3 months (around 05/07/2022) for routine visit  .    Jodi Mourning, NP-C     Portions of this note may be dictated using voice recognition software or a dictation service. Variances in spelling and vocabulary are possible and unintentional. Not all errors are caught/corrected. Please notify the [facility] if any discrepancies are noted or if the meaning of any statement is not clear.

## 2022-02-05 LAB — COMPREHENSIVE METABOLIC PNL, FASTING
ALBUMIN/GLOBULIN RATIO: 1.3 (ref 0.8–1.4)
ALBUMIN: 4.1 g/dL (ref 3.5–5.7)
ALKALINE PHOSPHATASE: 42 U/L (ref 34–104)
ALT (SGPT): 9 U/L (ref 7–52)
ANION GAP: 9 mmol/L (ref 4–13)
AST (SGOT): 16 U/L (ref 13–39)
BILIRUBIN TOTAL: 0.3 mg/dL (ref 0.3–1.2)
BUN/CREA RATIO: 15 (ref 6–22)
BUN: 11 mg/dL (ref 7–25)
CALCIUM, CORRECTED: 9.1 mg/dL (ref 8.9–10.8)
CALCIUM: 9.2 mg/dL (ref 8.6–10.3)
CHLORIDE: 105 mmol/L (ref 98–107)
CO2 TOTAL: 25 mmol/L (ref 21–31)
CREATININE: 0.75 mg/dL (ref 0.60–1.30)
ESTIMATED GFR: 99 mL/min/{1.73_m2} (ref >59)
GLOBULIN: 3.2 (ref 2.9–5.4)
GLUCOSE: 88 mg/dL (ref 74–109)
OSMOLALITY, CALCULATED: 276 mOsm/kg (ref 270–290)
POTASSIUM: 4 mmol/L (ref 3.5–5.1)
PROTEIN TOTAL: 7.3 g/dL (ref 6.4–8.9)
SODIUM: 139 mmol/L (ref 136–145)

## 2022-02-05 LAB — CBC
HCT: 41.7 % (ref 31.2–41.9)
HGB: 14.4 g/dL — ABNORMAL HIGH (ref 10.9–14.3)
MCH: 30.2 pg (ref 24.7–32.8)
MCHC: 34.5 g/dL (ref 32.3–35.6)
MCV: 87.6 fL (ref 75.5–95.3)
RBC COMMENT: NORMAL
RBC: 4.76 10*6/uL (ref 3.63–4.92)
RDW: 12.5 % (ref 12.3–17.7)
WBC: 10.3 10*3/uL (ref 3.8–11.8)

## 2022-02-05 LAB — HGA1C (HEMOGLOBIN A1C WITH EST AVG GLUCOSE): HEMOGLOBIN A1C: 5.3 % (ref 4.0–6.0)

## 2022-02-05 LAB — LIPID PANEL
CHOL/HDL RATIO: 5.1
CHOLESTEROL: 175 mg/dL (ref <200)
HDL CHOL: 34 mg/dL (ref 23–92)
LDL CALC: 85 mg/dL (ref 0–100)
TRIGLYCERIDES: 281 mg/dL — ABNORMAL HIGH (ref <=150)
VLDL CALC: 56 mg/dL — ABNORMAL HIGH (ref 0–50)

## 2022-02-05 LAB — MAGNESIUM: MAGNESIUM: 2 mg/dL (ref 1.9–2.7)

## 2022-02-05 LAB — THYROID STIMULATING HORMONE (SENSITIVE TSH): TSH: 0.469 u[IU]/mL (ref 0.450–5.330)

## 2022-02-05 LAB — VITAMIN D 25 TOTAL: VITAMIN D: 55 ng/mL (ref 30–100)

## 2022-02-05 LAB — VITAMIN B12: VITAMIN B 12: >1500 pg/mL — ABNORMAL HIGH (ref 180–914)

## 2022-02-06 ENCOUNTER — Other Ambulatory Visit (RURAL_HEALTH_CENTER): Payer: Self-pay | Admitting: Family

## 2022-02-06 ENCOUNTER — Encounter (RURAL_HEALTH_CENTER): Payer: Self-pay | Admitting: Family

## 2022-02-06 ENCOUNTER — Telehealth (RURAL_HEALTH_CENTER): Payer: Self-pay | Admitting: Family

## 2022-02-06 DIAGNOSIS — F17200 Nicotine dependence, unspecified, uncomplicated: Secondary | ICD-10-CM

## 2022-02-06 DIAGNOSIS — F17210 Nicotine dependence, cigarettes, uncomplicated: Secondary | ICD-10-CM

## 2022-02-20 HISTORY — PX: HX GALL BLADDER SURGERY/CHOLE: SHX55

## 2022-03-06 ENCOUNTER — Ambulatory Visit (RURAL_HEALTH_CENTER): Payer: Managed Care, Other (non HMO) | Attending: Family | Admitting: Family

## 2022-03-06 ENCOUNTER — Other Ambulatory Visit: Payer: Self-pay

## 2022-03-06 ENCOUNTER — Encounter (RURAL_HEALTH_CENTER): Payer: Self-pay | Admitting: Family

## 2022-03-06 VITALS — BP 129/75 | HR 75 | Temp 98.4°F | Resp 18 | Ht 65.0 in | Wt 286.1 lb

## 2022-03-06 DIAGNOSIS — Z01818 Encounter for other preprocedural examination: Secondary | ICD-10-CM | POA: Insufficient documentation

## 2022-03-06 DIAGNOSIS — Z6841 Body Mass Index (BMI) 40.0 and over, adult: Secondary | ICD-10-CM | POA: Insufficient documentation

## 2022-03-06 NOTE — Nursing Note (Signed)
Patient here today to paper work for  Gastric bypass  surgery.

## 2022-03-08 ENCOUNTER — Other Ambulatory Visit: Payer: Self-pay

## 2022-03-08 ENCOUNTER — Inpatient Hospital Stay (HOSPITAL_BASED_OUTPATIENT_CLINIC_OR_DEPARTMENT_OTHER)
Admission: RE | Admit: 2022-03-08 | Discharge: 2022-03-08 | Disposition: A | Discharge status: Home / Self Care | Payer: Managed Care, Other (non HMO) | Source: Ambulatory Visit

## 2022-03-08 ENCOUNTER — Inpatient Hospital Stay (HOSPITAL_COMMUNITY)
Admission: RE | Admit: 2022-03-08 | Discharge: 2022-03-08 | Disposition: A | Discharge status: Home / Self Care | Payer: Managed Care, Other (non HMO) | Source: Ambulatory Visit

## 2022-03-08 ENCOUNTER — Other Ambulatory Visit: Payer: Managed Care, Other (non HMO) | Attending: Family

## 2022-03-08 DIAGNOSIS — K219 Gastro-esophageal reflux disease without esophagitis: Secondary | ICD-10-CM | POA: Insufficient documentation

## 2022-03-08 DIAGNOSIS — Z6841 Body Mass Index (BMI) 40.0 and over, adult: Secondary | ICD-10-CM | POA: Insufficient documentation

## 2022-03-08 DIAGNOSIS — F17210 Nicotine dependence, cigarettes, uncomplicated: Secondary | ICD-10-CM | POA: Insufficient documentation

## 2022-03-08 DIAGNOSIS — Z01818 Encounter for other preprocedural examination: Secondary | ICD-10-CM

## 2022-03-08 DIAGNOSIS — E039 Hypothyroidism, unspecified: Secondary | ICD-10-CM | POA: Insufficient documentation

## 2022-03-08 DIAGNOSIS — E538 Deficiency of other specified B group vitamins: Secondary | ICD-10-CM

## 2022-03-08 DIAGNOSIS — E559 Vitamin D deficiency, unspecified: Secondary | ICD-10-CM

## 2022-03-08 DIAGNOSIS — F324 Major depressive disorder, single episode, in partial remission: Secondary | ICD-10-CM | POA: Insufficient documentation

## 2022-03-08 DIAGNOSIS — Z01810 Encounter for preprocedural cardiovascular examination: Secondary | ICD-10-CM

## 2022-03-08 DIAGNOSIS — F5101 Primary insomnia: Secondary | ICD-10-CM

## 2022-03-08 LAB — CBC
HCT: 40.9 % (ref 31.2–41.9)
HGB: 14.2 g/dL (ref 10.9–14.3)
MCH: 30.2 pg (ref 24.7–32.8)
MCHC: 34.8 g/dL (ref 32.3–35.6)
MCV: 86.7 fL (ref 75.5–95.3)
MPV: 9.7 fL (ref 7.9–10.8)
PLATELETS: 222 10*3/uL (ref 140–440)
RBC: 4.72 10*6/uL (ref 3.63–4.92)
RDW: 12.4 % (ref 12.3–17.7)
WBC: 7.8 10*3/uL (ref 3.8–11.8)

## 2022-03-08 LAB — COMPREHENSIVE METABOLIC PANEL, NON-FASTING
ALBUMIN/GLOBULIN RATIO: 1.3 (ref 0.8–1.4)
ALBUMIN: 4 g/dL (ref 3.5–5.7)
ALKALINE PHOSPHATASE: 41 U/L (ref 34–104)
ALT (SGPT): 11 U/L (ref 7–52)
ANION GAP: 7 mmol/L (ref 4–13)
AST (SGOT): 14 U/L (ref 13–39)
BILIRUBIN TOTAL: 0.5 mg/dL (ref 0.3–1.2)
BUN/CREA RATIO: 16 (ref 6–22)
BUN: 13 mg/dL (ref 7–25)
CALCIUM, CORRECTED: 9.2 mg/dL (ref 8.9–10.8)
CALCIUM: 9.2 mg/dL (ref 8.6–10.3)
CHLORIDE: 107 mmol/L (ref 98–107)
CO2 TOTAL: 25 mmol/L (ref 21–31)
CREATININE: 0.81 mg/dL (ref 0.60–1.30)
ESTIMATED GFR: 90 mL/min/{1.73_m2} (ref >59)
GLOBULIN: 3.1 (ref 2.9–5.4)
GLUCOSE: 89 mg/dL (ref 74–109)
OSMOLALITY, CALCULATED: 277 mOsm/kg (ref 270–290)
POTASSIUM: 3.8 mmol/L (ref 3.5–5.1)
PROTEIN TOTAL: 7.1 g/dL (ref 6.4–8.9)
SODIUM: 139 mmol/L (ref 136–145)

## 2022-03-08 LAB — ECG 12 LEAD
Atrial Rate: 70 {beats}/min
Calculated P Axis: 43 degrees
Calculated R Axis: 39 degrees
Calculated T Axis: 47 degrees
PR Interval: 150 ms
QRS Duration: 70 ms
QT Interval: 386 ms
QTC Calculation: 416 ms
Ventricular rate: 70 {beats}/min

## 2022-03-08 LAB — URINALYSIS, MICROSCOPIC
BACTERIA: NEGATIVE /hpf
RBCS: 2 /hpf (ref <4)
SQUAMOUS EPITHELIAL: 22 /hpf (ref <28)
WBCS: 3 /hpf (ref <6)

## 2022-03-08 LAB — COMPREHENSIVE METABOLIC PNL, FASTING
ALBUMIN/GLOBULIN RATIO: 1.3 (ref 0.8–1.4)
ALBUMIN: 4 g/dL (ref 3.5–5.7)
ALKALINE PHOSPHATASE: 41 U/L (ref 34–104)
ALT (SGPT): 12 U/L (ref 7–52)
ANION GAP: 6 mmol/L (ref 4–13)
AST (SGOT): 13 U/L (ref 13–39)
BILIRUBIN TOTAL: 0.5 mg/dL (ref 0.3–1.2)
BUN/CREA RATIO: 16 (ref 6–22)
BUN: 13 mg/dL (ref 7–25)
CALCIUM, CORRECTED: 9.1 mg/dL (ref 8.9–10.8)
CALCIUM: 9.1 mg/dL (ref 8.6–10.3)
CHLORIDE: 107 mmol/L (ref 98–107)
CO2 TOTAL: 26 mmol/L (ref 21–31)
CREATININE: 0.8 mg/dL (ref 0.60–1.30)
ESTIMATED GFR: 91 mL/min/{1.73_m2} (ref >59)
GLOBULIN: 3.1 (ref 2.9–5.4)
GLUCOSE: 89 mg/dL (ref 74–109)
OSMOLALITY, CALCULATED: 277 mOsm/kg (ref 270–290)
POTASSIUM: 3.8 mmol/L (ref 3.5–5.1)
PROTEIN TOTAL: 7.1 g/dL (ref 6.4–8.9)
SODIUM: 139 mmol/L (ref 136–145)

## 2022-03-08 LAB — LIPID PANEL
CHOL/HDL RATIO: 5
CHOLESTEROL: 160 mg/dL (ref <200)
HDL CHOL: 32 mg/dL (ref 23–92)
LDL CALC: 75 mg/dL (ref 0–100)
TRIGLYCERIDES: 267 mg/dL — ABNORMAL HIGH (ref <=150)
VLDL CALC: 53 mg/dL — ABNORMAL HIGH (ref 0–50)

## 2022-03-08 LAB — URINALYSIS, MACRO/MICRO
BILIRUBIN: NEGATIVE mg/dL
BLOOD: NEGATIVE mg/dL
GLUCOSE: NEGATIVE mg/dL
KETONES: NEGATIVE mg/dL
LEUKOCYTES: NEGATIVE WBCs/uL
NITRITE: NEGATIVE
PH: 5.5 (ref 5.0–9.0)
PROTEIN: NEGATIVE mg/dL
SPECIFIC GRAVITY: 1.024 (ref 1.002–1.030)
UROBILINOGEN: NORMAL mg/dL

## 2022-03-08 LAB — MAGNESIUM: MAGNESIUM: 1.9 mg/dL (ref 1.9–2.7)

## 2022-03-08 LAB — VITAMIN D 25 TOTAL: VITAMIN D: 44 ng/mL (ref 30–100)

## 2022-03-08 LAB — THYROID STIMULATING HORMONE (SENSITIVE TSH): TSH: 0.495 u[IU]/mL (ref 0.450–5.330)

## 2022-03-08 LAB — VITAMIN B12: VITAMIN B 12: 843 pg/mL (ref 180–914)

## 2022-03-25 DIAGNOSIS — E781 Pure hyperglyceridemia: Secondary | ICD-10-CM | POA: Insufficient documentation

## 2022-03-25 NOTE — Progress Notes (Signed)
[facility]  [address] [place] [address]  Phone: [phone] Fax: [phone]    Name: Lindsey Daugherty                       Date of Birth: 1975/09/03   MRN:  [id number]                         Date of visit: 03/06/2022     Chief Complaint:  Pre-operative Exam for Bariatric Surgery    Current Outpatient Medications   Medication Sig    ascorbic acid (SUPER C PO) Take 1 Tablet by mouth Once a day Super c with zinc    citalopram (CELEXA) 40 mg Oral Tablet Take 1 Tablet (40 mg total) by mouth Once a day for 90 days    drospirenone, contraceptive, (SLYND) 4 mg (28) Oral Tablet Take 1 Tablet (4 mg total) by mouth Once a day    furosemide (LASIX) 20 mg Oral Tablet Take 1 Tablet (20 mg total) by mouth Once per day as needed    levothyroxine (SYNTHROID) 200 mcg Oral Tablet Take 1 Tablet (200 mcg total) by mouth Every morning for 90 days    pantoprazole (PROTONIX) 40 mg Oral Tablet, Delayed Release (E.C.) Take 1 Tablet (40 mg total) by mouth Once a day for 90 days    prenatal 12/WPYK fu/folic acid (PRENATAL COMPLETE ORAL) Take 1 Tablet by mouth Once a day    valACYclovir (VALTREX) 500 mg Oral Tablet Take 1 Tablet (500 mg total) by mouth Once a day    ZYRTEC 10 mg Oral Tablet Take 1 Tablet (10 mg total) by mouth       Patient Active Problem List    Diagnosis Date Noted    Hypertriglyceridemia 03/25/2022    Depression 09/19/2021    Acquired hypothyroidism 09/19/2021    Tobacco dependence due to cigarettes 09/19/2021    Gastroesophageal reflux disease 09/19/2021    Insomnia 09/19/2021    Vitamin D deficiency 09/19/2021    B12 deficiency 09/19/2021    Morbid obesity with BMI of 45.0-49.9, adult (CMS [place]) 09/19/2021       Subjective:   Patient is here for Pre Op Clearance    Surgery Details   Site: [facility]   Details: Bariatric Surgery    Red Flags   New or Exacerbated medical problems: none   Previous surgical intolerances: none   Previous surgical complications: none    Cardiovascular  assessment   Active cardiac conditions: none   Current cardiac symptoms: none.  EKG: Normal Sinus Rhythm.   Associated cardiac risk factors: Tobacco use, obesity    Functional Capacity: 4 met; ability to ambulate at least 200 ft without assistance.      Pulmonary Assessment   Sleep Apnea: none   Pulmonary Risk factors: Tobacco use, obesity    Renal Assessment   CKD: none   Most recent renal function: Creatinine 0.87, GFR 90.    Coagulation   ASA: not used.   Warfarin: not used.   NOAC: not used.   Labs:  Hgb 14.2; Hct 40.9    Meds to stop before surgery: none  Meds to take on day of surgery: per surgeon    ROS:  10 systems reviewed and were negative except as noted.       Objective :  BP 129/75 (Site: Left, Patient Position: Sitting)   Pulse 75   Temp 36.9 C (  98.4 F) (Temporal)   Resp 18   Ht 1.651 m (_0 )   Wt 130 kg (286 lb 2 oz)   LMP 12/01/2021   SpO2 97%   BMI 47.61 kg/m     Physical Exam  Constitutional:       Appearance: She is obese.   HENT:      Head: Normocephalic.      Right Ear: Tympanic membrane, ear canal and external ear normal.      Left Ear: Tympanic membrane, ear canal and external ear normal.      Nose: Nose normal.      Mouth/Throat:      Mouth: Mucous membranes are moist.      Pharynx: Oropharynx is clear.   Eyes:      Extraocular Movements: Extraocular movements intact.      Conjunctiva/sclera: Conjunctivae normal.      Pupils: Pupils are equal, round, and reactive to light.   Cardiovascular:      Rate and Rhythm: Normal rate and regular rhythm.      Pulses: Normal pulses.      Heart sounds: Normal heart sounds, S1 normal and S2 normal.   Pulmonary:      Effort: Pulmonary effort is normal.      Breath sounds: Normal breath sounds.   Abdominal:      General: Bowel sounds are normal.      Palpations: Abdomen is soft.   Musculoskeletal:         General: Normal range of motion.      Cervical back: Normal range of motion and neck supple.      Right lower leg: No edema.      Left lower  leg: No edema.   Skin:     General: Skin is warm.      Capillary Refill: Capillary refill takes less than 2 seconds.   Neurological:      Mental Status: She is alert.      Cranial Nerves: Cranial nerves 2-12 are intact.      Coordination: Coordination is intact.      Gait: Gait is intact.   Psychiatric:         Mood and Affect: Mood normal.         Speech: Speech normal.         Behavior: Behavior is cooperative.         Thought Content: Thought content normal.         Cognition and Memory: Cognition normal.          Data reviewed:CBC, CMP, urinalysis, EKG and chest xray.    Assessment/Plan:    ASSESSMENT/PLAN:   1. Preoperative Consultation for gastric bypass surgery under local / MAC / General anesthesia. Patient has 0 risk factors for major cardiac complications by the Revised Goldman Cardiac Risk Index. This puts him/her at approximately 3% risk of a major cardiac complication during the planned procedure. Patient has been informed of this risk, is willing to proceed, and thus is medically cleared for the planned procedure.       Jodi Mourning, NP-C

## 2022-04-24 IMAGING — DX XRAY LUMBAR SPINE MINIMUM 4 VIEWS
1 series · 5 of 5 positions shown · non-contrast
Comparison: None available.

﻿EXAM:  [id number]      XRAY LUMBAR SPINE MINIMUM 4 VIEWS
INDICATION: Chronic low back pain for 2 years.

[Series 1: AP · 0.14mm/px · 5 of 5 slices shown]
[im 1/5]
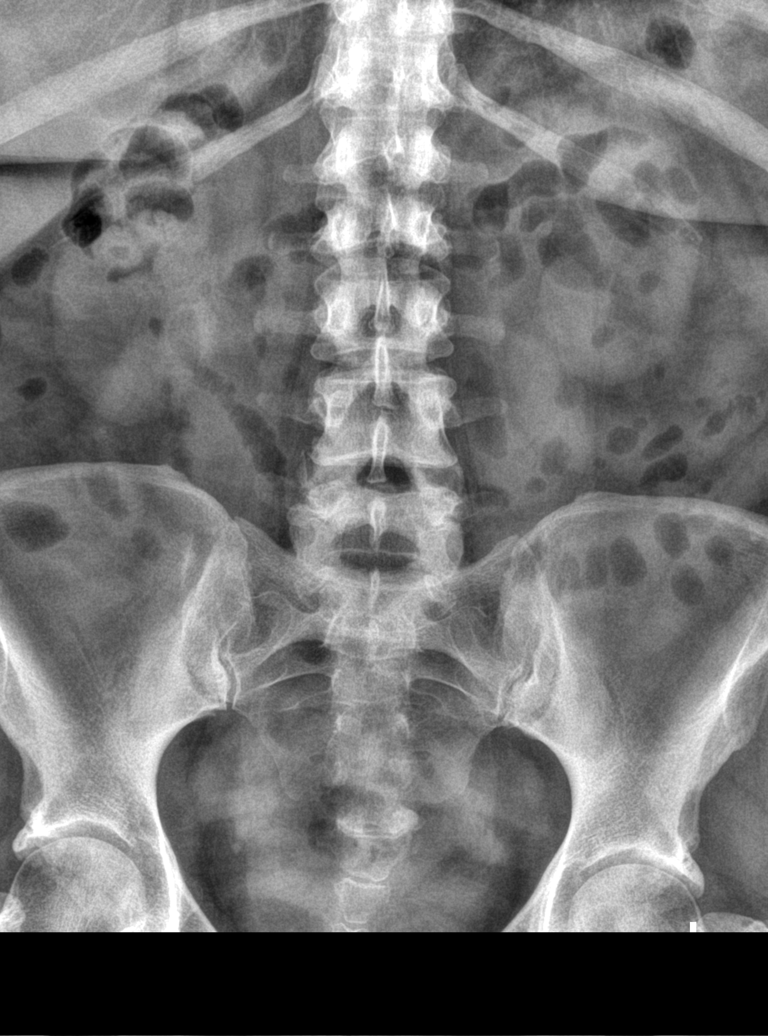
[im 2/5]
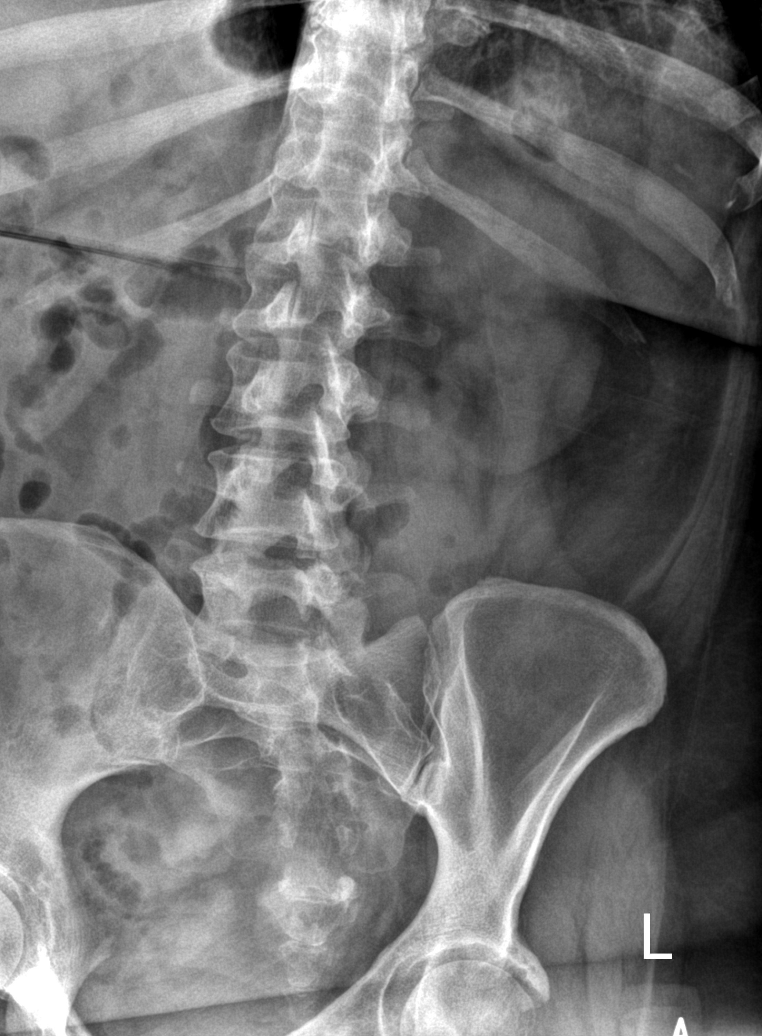
[im 3/5]
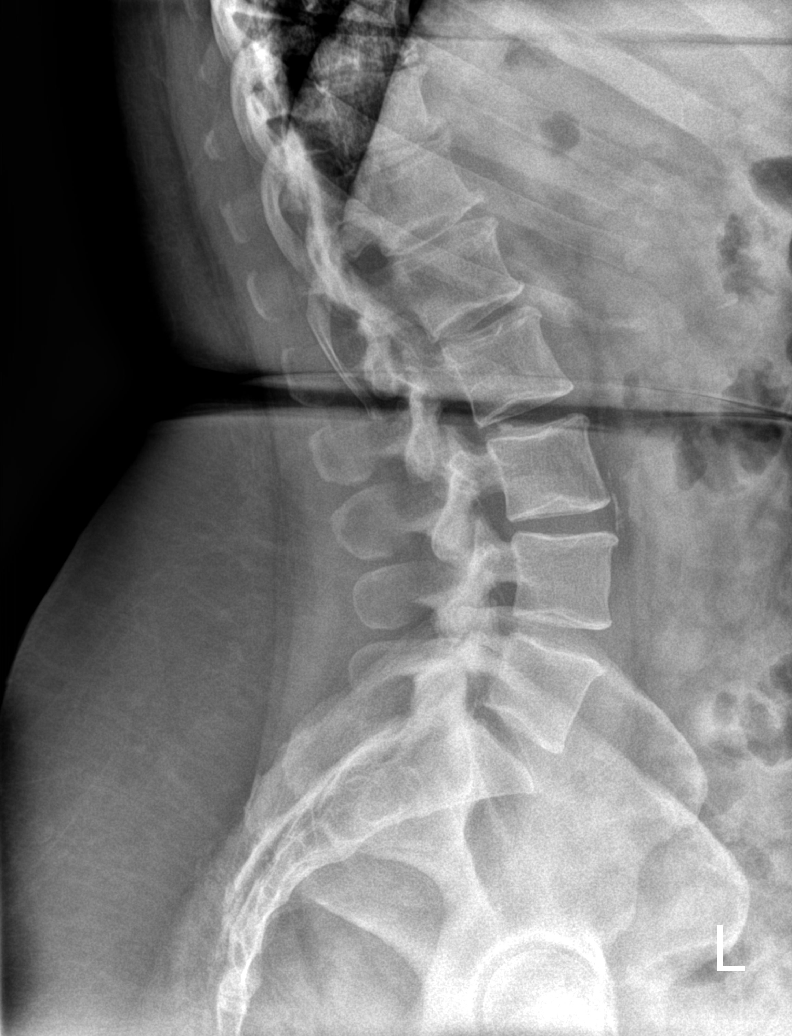
[im 4/5]
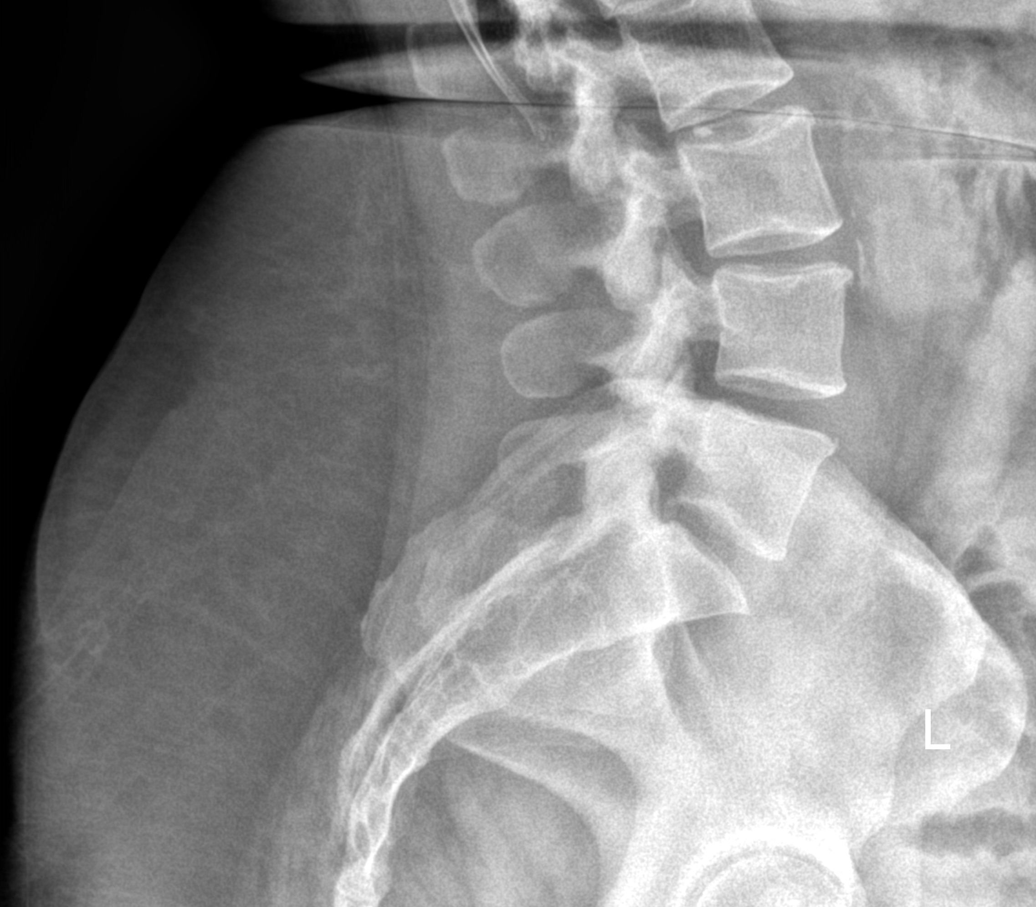
[im 5/5]
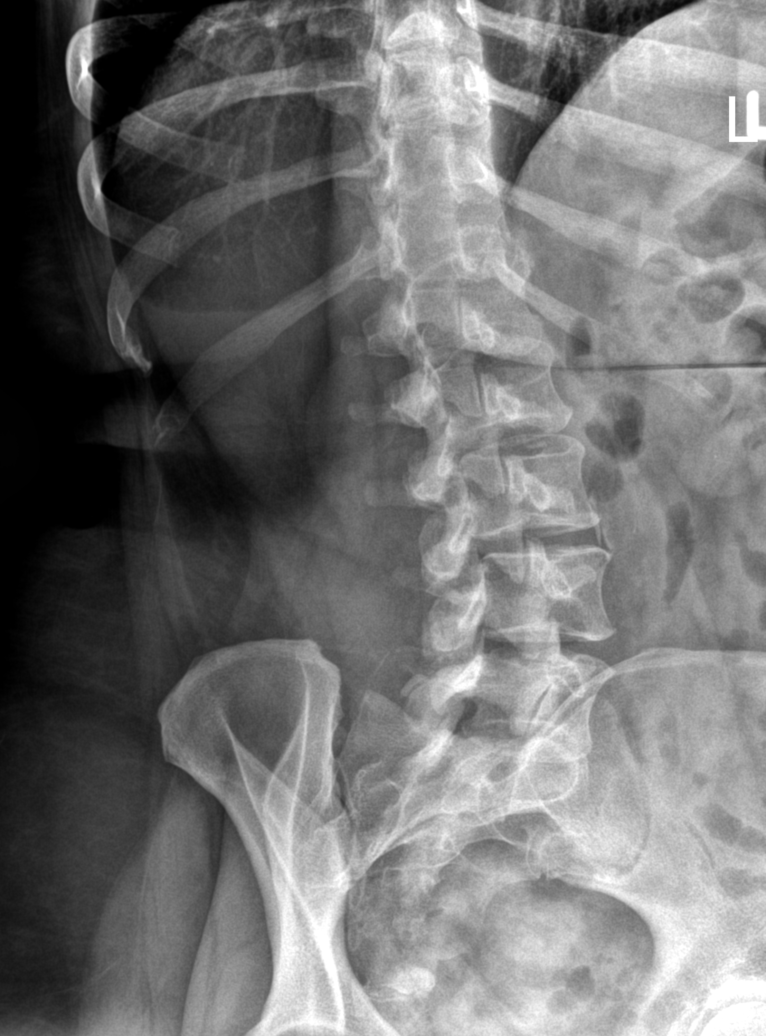

[5 of 5 positions shown; findings below may reference images not displayed]

FINDINGS: Five lumbar segments are noted.  Moderate multilevel degenerative disc changes are seen. Facet arthropathy is noted predominantly at L4-5 level.  Soft tissues are unremarkable.
IMPRESSION: Moderate multilevel degenerative disc changes. Mild facet arthropathy at L4-5 level.  If symptoms are persistent and not responding to conservative treatment, further evaluation with MRI is indicated.

## 2022-05-08 ENCOUNTER — Other Ambulatory Visit: Payer: Self-pay

## 2022-05-08 ENCOUNTER — Encounter (RURAL_HEALTH_CENTER): Payer: Self-pay | Admitting: Family

## 2022-05-08 ENCOUNTER — Ambulatory Visit (RURAL_HEALTH_CENTER): Payer: Managed Care, Other (non HMO) | Attending: Family | Admitting: Family

## 2022-05-08 VITALS — BP 133/90 | HR 83 | Temp 99.8°F | Resp 18 | Ht 65.0 in | Wt 287.4 lb

## 2022-05-08 DIAGNOSIS — F418 Other specified anxiety disorders: Secondary | ICD-10-CM

## 2022-05-08 DIAGNOSIS — Z634 Disappearance and death of family member: Secondary | ICD-10-CM

## 2022-05-08 DIAGNOSIS — G4700 Insomnia, unspecified: Secondary | ICD-10-CM | POA: Insufficient documentation

## 2022-05-08 DIAGNOSIS — E039 Hypothyroidism, unspecified: Secondary | ICD-10-CM | POA: Insufficient documentation

## 2022-05-08 DIAGNOSIS — F17210 Nicotine dependence, cigarettes, uncomplicated: Secondary | ICD-10-CM

## 2022-05-08 DIAGNOSIS — E559 Vitamin D deficiency, unspecified: Secondary | ICD-10-CM

## 2022-05-08 DIAGNOSIS — K219 Gastro-esophageal reflux disease without esophagitis: Secondary | ICD-10-CM

## 2022-05-08 DIAGNOSIS — J3089 Other allergic rhinitis: Secondary | ICD-10-CM | POA: Insufficient documentation

## 2022-05-08 DIAGNOSIS — Z6841 Body Mass Index (BMI) 40.0 and over, adult: Secondary | ICD-10-CM | POA: Insufficient documentation

## 2022-05-08 DIAGNOSIS — F4321 Adjustment disorder with depressed mood: Secondary | ICD-10-CM | POA: Insufficient documentation

## 2022-05-08 DIAGNOSIS — E538 Deficiency of other specified B group vitamins: Secondary | ICD-10-CM | POA: Insufficient documentation

## 2022-05-08 DIAGNOSIS — E781 Pure hyperglyceridemia: Secondary | ICD-10-CM | POA: Insufficient documentation

## 2022-05-08 HISTORY — DX: Disappearance and death of family member: Z63.4

## 2022-05-08 MED ORDER — DEXAMETHASONE SODIUM PHOSPHATE 4 MG/ML INJECTION SOLUTION
4.0000 mg | INTRAMUSCULAR | Status: AC
Start: 2022-05-08 — End: 2022-05-08
  Administered 2022-05-08: 10 mg via INTRAMUSCULAR

## 2022-05-08 MED ORDER — CITALOPRAM 40 MG TABLET
40.0000 mg | ORAL_TABLET | Freq: Every day | ORAL | 1 refills | Status: DC
Start: 2022-05-08 — End: 2022-08-23

## 2022-05-08 MED ORDER — ZOLPIDEM 5 MG TABLET
5.0000 mg | ORAL_TABLET | Freq: Every evening | ORAL | 2 refills | Status: DC
Start: 2022-05-08 — End: 2022-06-12

## 2022-05-08 MED ORDER — FLUTICASONE PROPIONATE 50 MCG/ACTUATION NASAL SPRAY,SUSPENSION
1.0000 | Freq: Every day | NASAL | 1 refills | Status: AC
Start: 2022-05-08 — End: 2022-06-12

## 2022-05-08 MED ORDER — LEVOTHYROXINE 200 MCG TABLET
200.0000 ug | ORAL_TABLET | Freq: Every morning | ORAL | 1 refills | Status: DC
Start: 2022-05-08 — End: 2022-08-23

## 2022-05-08 MED ORDER — LOVASTATIN 20 MG TABLET
20.0000 mg | ORAL_TABLET | Freq: Every evening | ORAL | 1 refills | Status: DC
Start: 2022-05-08 — End: 2022-08-23

## 2022-05-08 MED ORDER — PANTOPRAZOLE 40 MG TABLET,DELAYED RELEASE
40.0000 mg | DELAYED_RELEASE_TABLET | Freq: Every day | ORAL | 1 refills | Status: DC
Start: 2022-05-08 — End: 2022-08-23

## 2022-05-08 NOTE — Addendum Note (Signed)
Addended by: Gershon Cull on: 05/08/2022 11:39 AM     Modules accepted: Orders

## 2022-05-08 NOTE — Assessment & Plan Note (Signed)
Patient is currently under the care of [facility] weight loss center, Dr. Leilani Merl.  Planning on gastric bypass surgery in the future

## 2022-05-08 NOTE — Assessment & Plan Note (Signed)
Advised vitamin-D 4000 IU daily.

## 2022-05-08 NOTE — Progress Notes (Signed)
[facility] [place]  [address] [place] [address]  Operated by [facility]     Name: Lindsey Daugherty MRN:  [id number]   Date of Birth: 1976/03/20 Age: 47 y.o.   Date: 05/08/2022  Time: 11:15     Provider: Jodi Mourning, NP-C    Reason for visit: Follow Up 3 Months      History of Present Illness:  Lindsey Daugherty is a 47 y.o. female presenting with chronic disease management.      Patient reports her dad passed away 08-Mar-2022 and she is difficult time dealing with his death. Patient reports she is having panic attacks and would like Xanax, Xanax has worked well for her in the past.  Patient reports she is living in the same house that her father died in.  Patient states when she hears an ambulance she gets overwhelmed with emotion.  Denies Si/HI/AVH.    Patient requesting referral to Pulmonologist to be be tested for sleep apnea. Denies snoring.  Current smoker and obese.  Patient complains of fatigue and does not feel rested after sleeping.  Reports take Ambien to sleep without feeling well rested upon wakening.  Reports everyone in her family has sleep apnea.    Patient Active Problem List    Diagnosis Date Noted    Grief 05/08/2022    Environmental and seasonal allergies 05/08/2022    Hypertriglyceridemia 03/25/2022    Depression with anxiety 09/19/2021    Acquired hypothyroidism 09/19/2021    Tobacco dependence due to cigarettes 09/19/2021    Gastroesophageal reflux disease 09/19/2021    Insomnia 09/19/2021    Vitamin D deficiency 09/19/2021    B12 deficiency 09/19/2021    Morbid obesity with BMI of 45.0-49.9, adult (CMS [place]) 09/19/2021       Historical Data    Past Medical History:  Past Medical History:   Diagnosis Date    Esophageal reflux     Insomnia     Migraine     Thyroid cancer (CMS [place]) 2018     Past Surgical History:  Past Surgical History:   Procedure Laterality Date    HX CHOLECYSTECTOMY  02/20/2022    HX PARTIAL  THYROIDECTOMY Right 2018    No chemo/radiation; dx thyroid cancer    HX PARTIAL THYROIDECTOMY Left 2020    no chemo/radiation    HX TONSILLECTOMY  1981     Allergies:  Allergies   Allergen Reactions    Loratadine Shortness of Breath     claritan D    Pseudoephedrine Shortness of Breath    Bactrim [Sulfamethoxazole-Trimethoprim] Itching    Vitamin E (D-Alpha Tocopherol) Itching     Medications:  Current Outpatient Medications   Medication Sig    ascorbic acid (SUPER C PO) Take 1 Tablet by mouth Once a day Super c with zinc    citalopram (CELEXA) 40 mg Oral Tablet Take 1 Tablet (40 mg total) by mouth Once a day for 90 days    drospirenone, contraceptive, (SLYND) 4 mg (28) Oral Tablet Take 1 Tablet (4 mg total) by mouth Once a day    fluticasone propionate (FLONASE) 50 mcg/actuation Nasal Spray, Suspension Administer 1 Spray into each nostril Once a day for 30 days    furosemide (LASIX) 20 mg Oral Tablet Take 1 Tablet (20 mg total) by mouth Once per day as needed    levothyroxine (SYNTHROID) 200 mcg Oral Tablet Take 1 Tablet (200 mcg total) by  mouth Every morning for 90 days    lovastatin (MEVACOR) 20 mg Oral Tablet Take 1 Tablet (20 mg total) by mouth Every evening with dinner for 90 days    pantoprazole (PROTONIX) 40 mg Oral Tablet, Delayed Release (E.C.) Take 1 Tablet (40 mg total) by mouth Once a day for 90 days    prenatal 75/IEPP fu/folic acid (PRENATAL COMPLETE ORAL) Take 1 Tablet by mouth Once a day    valACYclovir (VALTREX) 500 mg Oral Tablet Take 1 Tablet (500 mg total) by mouth Once a day    zolpidem (AMBIEN) 5 mg Oral Tablet Take 1 Tablet (5 mg total) by mouth Every night for 30 days    ZYRTEC 10 mg Oral Tablet Take 1 Tablet (10 mg total) by mouth     Family History:  Family Medical History:       Problem Relation (Age of Onset)    Diabetes type II Father    Elevated Lipids Father    Hypertension (High Blood Pressure) Father    No Known Problems Mother, Sister, Brother, Half-Sister, Half-Brother, Maternal  Aunt, Maternal Uncle, Paternal Aunt, Paternal Uncle, Maternal Grandmother, Maternal Grandfather, Paternal Grandmother, Paternal Grandfather, Daughter, Son            Social History:  Social History     Socioeconomic History    Marital status: Divorced    Number of children: 0    Highest education level: Associate degree: academic program   Occupational History    Occupation: Pharmaceutical   Tobacco Use    Smoking status: Every Day     Packs/day: 0.50     Years: 28.00     Additional pack years: 0.00     Total pack years: 14.00     Types: Cigarettes     Start date: 1995    Smokeless tobacco: Never   Vaping Use    Vaping Use: Never used   Substance and Sexual Activity    Alcohol use: Never    Drug use: Never    Sexual activity: Not Currently     Partners: Male     Comment: LMP: 12/04/21 ON ORAL CONTRACEPTION; G2P0A2     Social Determinants of Health     Financial Resource Strain: Low Risk  (05/08/2022)    Financial Resource Strain     SDOH Financial: No   Transportation Needs: Low Risk  (05/08/2022)    Transportation Needs     SDOH Transportation: No   Social Connections: Low Risk  (05/08/2022)    Social Connections     SDOH Social Isolation: 5 or more times a week   Intimate Partner Violence: Low Risk  (05/08/2022)    Intimate Partner Violence     SDOH Domestic Violence: No   Housing Stability: Low Risk  (05/08/2022)    Housing Stability     SDOH Housing Situation: I have housing.     SDOH Housing Worry: No           Review of Systems:  Any pertinent Review of Systems as addressed in the HPI above.    Physical Exam:  Vital Signs:  Vitals:    05/08/22 1000   BP: (!) 133/90   Pulse: 83   Resp: 18   Temp: 37.7 C (99.8 F)   SpO2: 99%   Weight: 130 kg (287 lb 6.4 oz)   Height: 1.651 m ('5\' 5"'$ )   BMI: 47.93     Physical Exam  Vitals reviewed.   Constitutional:  Appearance: Normal appearance. She is obese.   HENT:      Head: Normocephalic.      Right Ear: Tympanic membrane is bulging.      Left Ear: Tympanic membrane is  bulging.      Nose: Nose normal.      Right Turbinates: Enlarged, swollen and pale.      Left Turbinates: Enlarged, swollen and pale.      Mouth/Throat:      Pharynx: Oropharynx is clear.   Eyes:      Extraocular Movements: Extraocular movements intact.      Conjunctiva/sclera: Conjunctivae normal.      Pupils: Pupils are equal, round, and reactive to light.   Neck:      Thyroid: No thyroid mass, thyromegaly or thyroid tenderness.   Cardiovascular:      Rate and Rhythm: Normal rate and regular rhythm.      Pulses: Normal pulses.      Heart sounds: Normal heart sounds, S1 normal and S2 normal.   Pulmonary:      Effort: Pulmonary effort is normal.      Breath sounds: Normal breath sounds.   Abdominal:      General: Bowel sounds are normal.      Palpations: Abdomen is soft.   Musculoskeletal:         General: Normal range of motion.      Cervical back: Normal range of motion and neck supple.      Right lower leg: No edema.      Left lower leg: No edema.   Skin:     General: Skin is warm and dry.   Neurological:      General: No focal deficit present.      Mental Status: She is alert and oriented to person, place, and time.      Motor: Motor function is intact.      Coordination: Coordination is intact.      Gait: Gait is intact.   Psychiatric:         Mood and Affect: Mood normal.         Behavior: Behavior normal. Behavior is cooperative.         Thought Content: Thought content normal.         Cognition and Memory: Cognition normal.         Judgment: Judgment normal.          Assessment/Plan:  (E03.9) Acquired hypothyroidism  (primary encounter diagnosis)    (E78.1) Hypertriglyceridemia    (K21.9) Gastroesophageal reflux disease    (F41.8) Depression with anxiety  Plan: Referral to [place]    (F43.21) Grief  Plan: Referral to [place]    (F17.210) Tobacco dependence due to cigarettes  Plan: Referral to External Provider    (E66.01,  [phone]) Morbid obesity with BMI of  45.0-49.9, adult (CMS [facility])  Plan: Referral to External Provider    (E55.9) Vitamin D deficiency    (E53.8) B12 deficiency    (G47.00) Insomnia    (J30.89) Environmental and seasonal allergies       Problem List Items Addressed This Visit          Cardiovascular System    Hypertriglyceridemia     Discussed lipid panel.  Start lovastatin 20 mg daily to lower triglycerides levels.  Advise to limit high fat foods, processed foods and fast foods in diet.  Discussed side effects.  Rechecked labs in 3 months.  Neurologic    Insomnia     Continue Ambien 5 mg q.h.s..  Discussed sleep hygiene.  Advise the importance of being on a sleep routine.            Digestive    Gastroesophageal reflux disease     Symptoms well controlled with Protonix 40 mg daily.  Continue current medications.            Endocrine    Acquired hypothyroidism - Primary     Continue levothyroxine 200 mcg daily.  Patient denies missed doses.  Advised to take levothyroxine in the morning on an empty stomach and wait 30 minutes prior to eating/drinking.         Vitamin D deficiency     Advised vitamin-D 4000 IU daily.         B12 deficiency     Continue vitamin B12 1000 mcg daily.            Psychiatric    Depression with anxiety     Patient is currently taking citalopram 40 mg daily for depression/anxiety.  Reports worsening depression and anxiety.  Denies SI/HI/AVH.  Patient reports she is unable to tolerate Wellbutrin in the past.   Referred to [facility] for further evaluation and management.         Relevant Orders    Referral to [facility]    Tobacco dependence due to cigarettes     Strongly advised tobacco cessation.  Patient is not interested in smoking cessation at this time.  Declines smoking cessation referral.         Relevant Orders    Referral to External Provider    Grief     Patient is grieving the loss of her father in November 2023.  Patient reports her current medications do not seem to be  working as well.  Referred to [facility] for further evaluation and management.         Relevant Orders    Referral to [place]       Other    Morbid obesity with BMI of 45.0-49.9, adult (CMS [facility])     Patient is currently under the care of [facility] weight loss center, Dr. Leilani Merl.  Planning on gastric bypass surgery in the future         Relevant Orders    Referral to External Provider    Environmental and seasonal allergies     Advised to take an over-the-counter daily antihistamine.  Start fluticasone nasal spray 1 spray each nostril daily.  Strongly advised smoking cessation.  Hydrate well.           Discussed labs today  Continue current medications. Referred to the [facility] for management of depression, anxiety and grief.     Advised a low-fat/low sodium diet, advised 150 minutes of scheduled weekly physical activity as tolerated.  Advised to maintain a healthy weight.   Return in about 3 months (around 08/07/2022) for routine visit.    Jodi Mourning, NP-C     Portions of this note may be dictated using voice recognition software or a dictation service. Variances in spelling and vocabulary are possible and unintentional. Not all errors are caught/corrected. Please notify the [facility] if any discrepancies are noted or if the meaning of any statement is not clear.

## 2022-05-08 NOTE — Assessment & Plan Note (Signed)
Continue vitamin B12 1000 mcg daily.

## 2022-05-08 NOTE — Assessment & Plan Note (Signed)
Patient is grieving the loss of her father in November 2023.  Patient reports her current medications do not seem to be working as well.  Referred to [facility] for further evaluation and management.

## 2022-05-08 NOTE — Assessment & Plan Note (Addendum)
Patient is currently taking citalopram 40 mg daily for depression/anxiety.  Reports worsening depression and anxiety.  Denies SI/HI/AVH.  Patient reports she is unable to tolerate Wellbutrin in the past.   Referred to [facility] for further evaluation and management.

## 2022-05-08 NOTE — Assessment & Plan Note (Signed)
Strongly advised tobacco cessation.  Patient is not interested in smoking cessation at this time.  Declines smoking cessation referral.

## 2022-05-08 NOTE — Assessment & Plan Note (Signed)
Advised to take an over-the-counter daily antihistamine.  Start fluticasone nasal spray 1 spray each nostril daily.  Strongly advised smoking cessation.  Hydrate well.

## 2022-05-08 NOTE — Assessment & Plan Note (Signed)
Continue levothyroxine 200 mcg daily.  Patient denies missed doses.  Advised to take levothyroxine in the morning on an empty stomach and wait 30 minutes prior to eating/drinking.

## 2022-05-08 NOTE — Assessment & Plan Note (Signed)
Symptoms well controlled with Protonix 40 mg daily.  Continue current medications.

## 2022-05-08 NOTE — Assessment & Plan Note (Signed)
Continue Ambien 5 mg q.h.s..  Discussed sleep hygiene.  Advise the importance of being on a sleep routine.

## 2022-05-08 NOTE — Assessment & Plan Note (Signed)
Discussed lipid panel.  Start lovastatin 20 mg daily to lower triglycerides levels.  Advise to limit high fat foods, processed foods and fast foods in diet.  Discussed side effects.  Rechecked labs in 3 months.

## 2022-05-31 IMAGING — MR MRI THORACIC SPINE WITHOUT CONTRAST
4 of 5 series · 19 of 48 positions shown · IV contrast (gadolinium)
Comparison: Lumbar spine x-rays dated 04/24/2022. No prior images of thoracic spine are available for comparison.

﻿EXAM:  [id number]   MRI THORACIC SPINE WITHOUT CONTRAST
INDICATION: 46-year-old with persistent upper back pain and low back pain.
TECHNIQUE: Multiplanar multisequential MRI of the thoracic spine was performed without gadolinium contrast.

[Series 8: T2 · sagittal · 5.0mm · 0.78mm/px · 8 of 11 slices shown (1 of 2)]
[im 1/11]
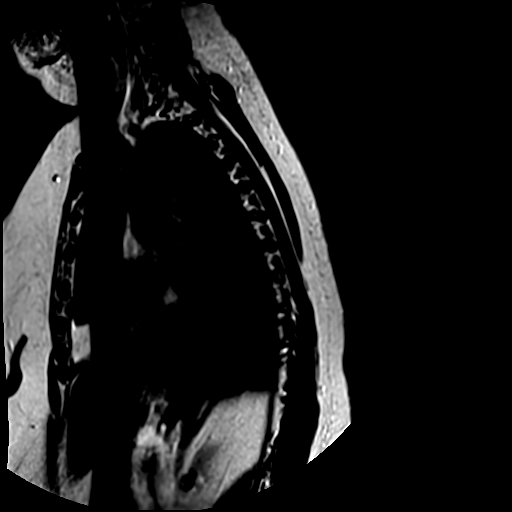
[im 2/11]
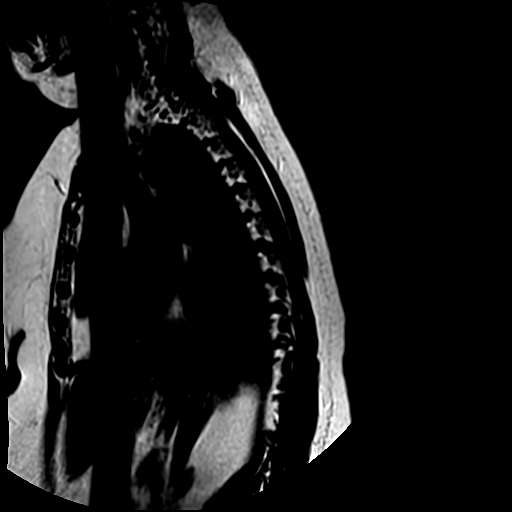
[im 4/11]
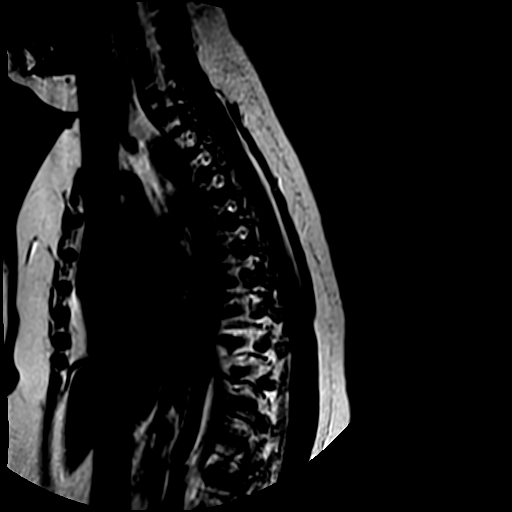
[im 5/11]
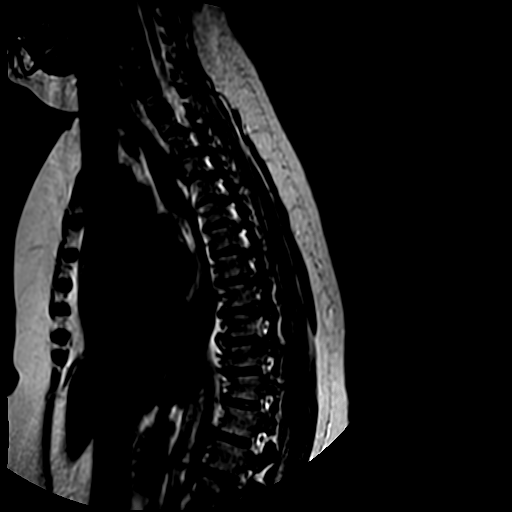
[im 6/11]
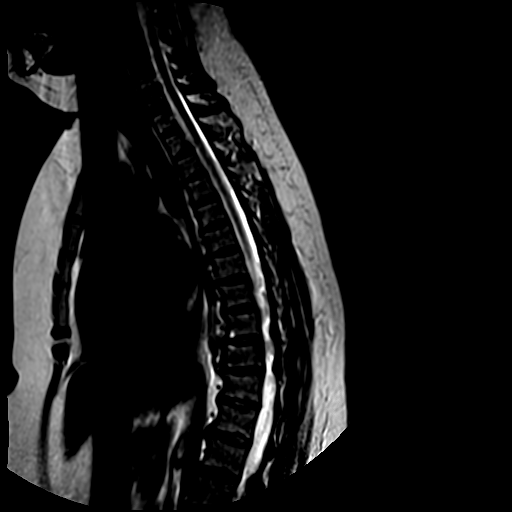
[im 7/11]
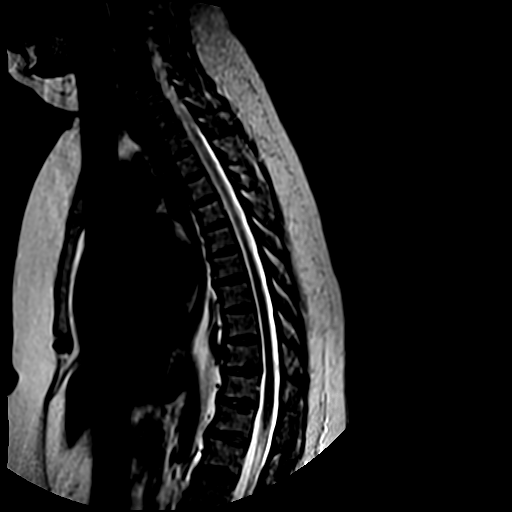
[im 9/11]
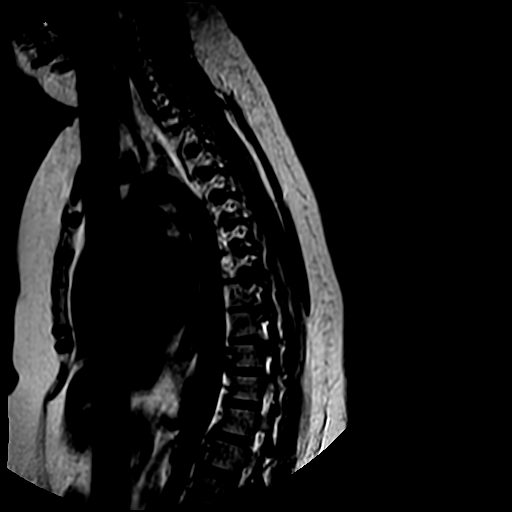
[im 11/11]
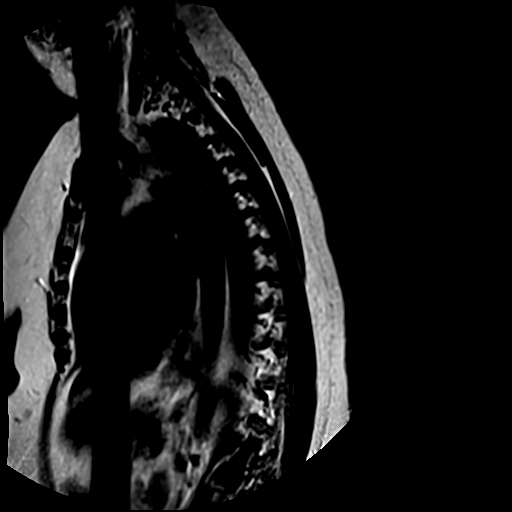

[Series 9: T1 · sagittal · 5.0mm · 0.78mm/px · 3 of 11 slices shown (1 of 2)]
[im 2/11]
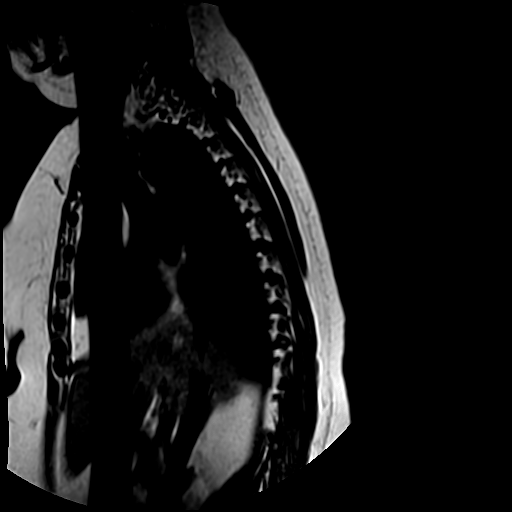
[im 6/11]
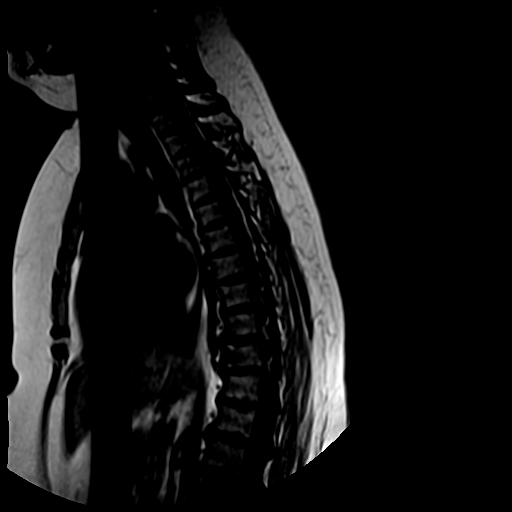
[im 9/11]
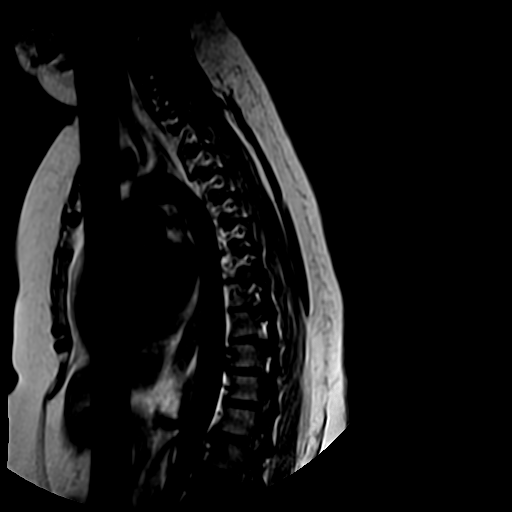

[Series 11: T1 · axial · 5.0mm · 0.45mm/px · z∈[-155,-39]mm · 3 of 12 slices shown (2 of 2)]
[im 2/12]
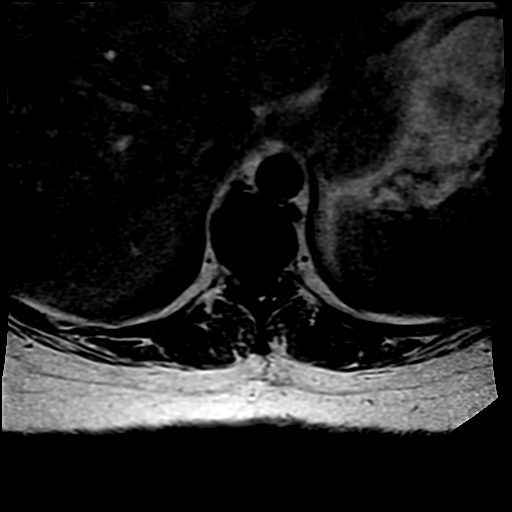
[im 7/12]
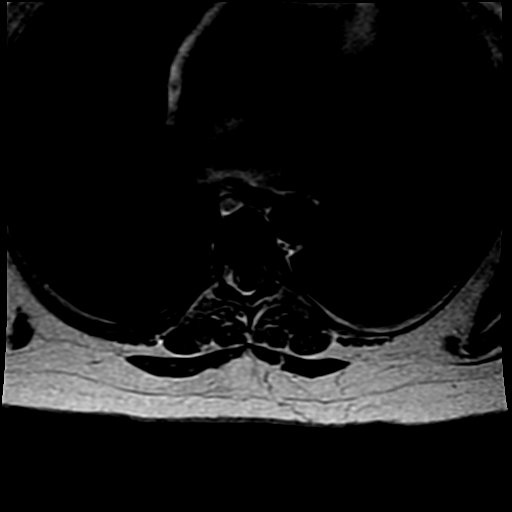
[im 10/12]
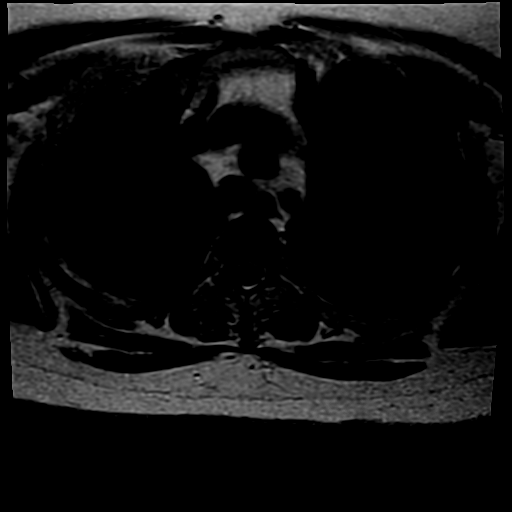

[Series 12: T2 · axial · 5.0mm · 0.45mm/px · z∈[-174,-39]mm · 5 of 12 slices shown (2 of 2)]
[im 1/12]
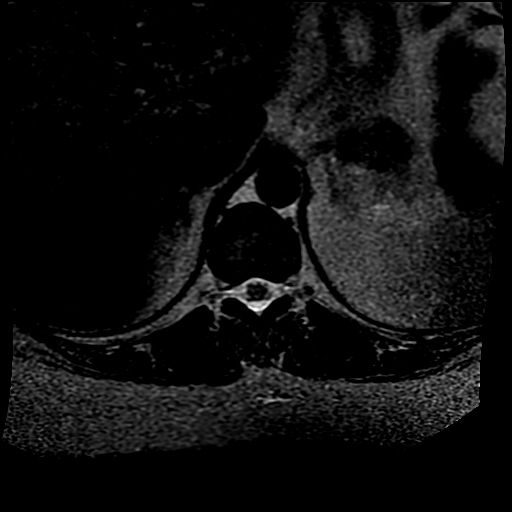
[im 2/12]
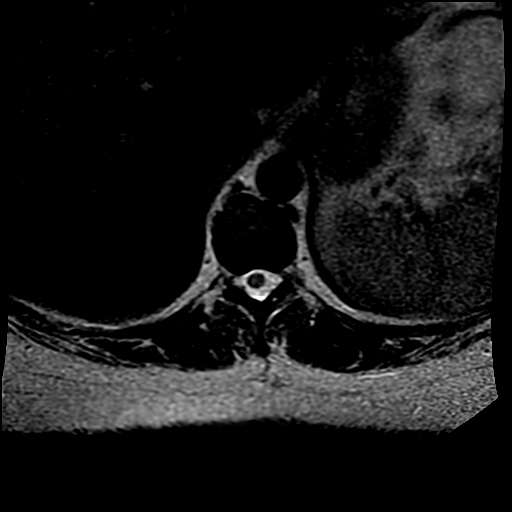
[im 4/12]
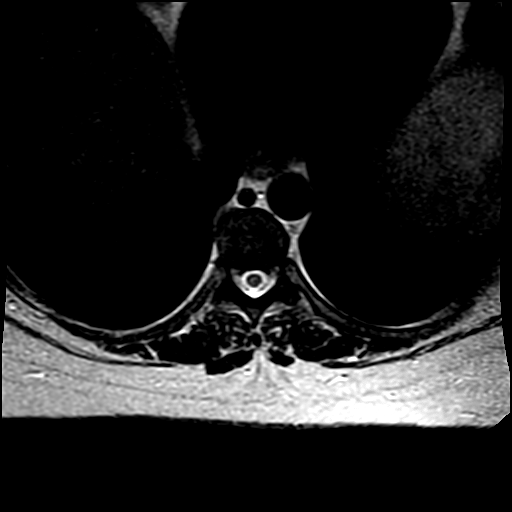
[im 7/12]
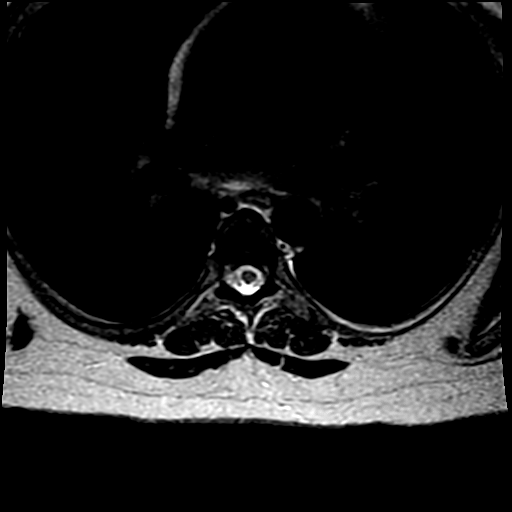
[im 10/12]
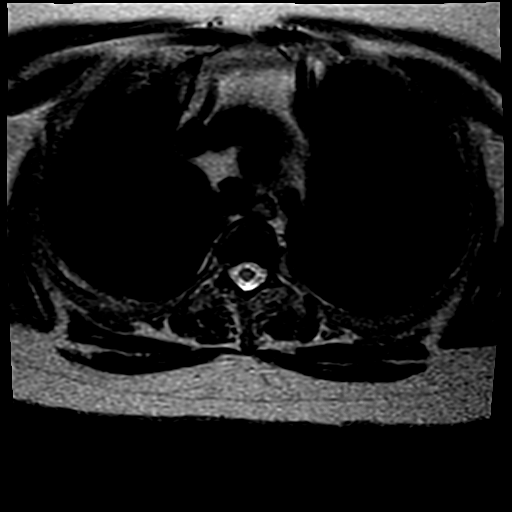

[19 of 48 positions shown; findings below may reference images not displayed]

FINDINGS: No acute bony lesions or focal bone changes of thoracic vertebrae are seen.  

Mild degenerative disc disease is noted at T10-T11 level.  No compromise of the thecal sac or neural foramen is seen.  Thoracic spinal cord shows no focal lesions.  Paravertebral soft tissues are unremarkable.
IMPRESSION: 1. No acute or focal bone changes of thoracic vertebrae.  

2. Mild degenerative disc changes in the lower T-spine at T10-T11 level.  No evidence of spinal stenosis or neural foraminal stenosis is seen.

3. Thoracic spinal cord shows no focal abnormalities.

## 2022-06-05 ENCOUNTER — Other Ambulatory Visit: Payer: Self-pay

## 2022-06-05 ENCOUNTER — Ambulatory Visit: Payer: Managed Care, Other (non HMO) | Attending: Clinical | Admitting: Clinical

## 2022-06-05 DIAGNOSIS — F4321 Adjustment disorder with depressed mood: Secondary | ICD-10-CM | POA: Insufficient documentation

## 2022-06-05 DIAGNOSIS — F329 Major depressive disorder, single episode, unspecified: Secondary | ICD-10-CM

## 2022-06-05 DIAGNOSIS — F411 Generalized anxiety disorder: Secondary | ICD-10-CM

## 2022-06-05 DIAGNOSIS — F410 Panic disorder [episodic paroxysmal anxiety] without agoraphobia: Secondary | ICD-10-CM | POA: Insufficient documentation

## 2022-06-05 NOTE — Psychotherapy Note (Signed)
[place]    Initial Assessment     Name: Lindsey Daugherty  MRN: [id number]  Date of Birth: [birth year]  Date of Evaluation: 06/05/2022  Phone:   Home Phone [phone]   Work Phone [phone]   Mobile [phone]       Information gathered from review of chart and patient interview.    Time Start: 8:00 Am   Time End: 9:00 am    Identification:   Patient is a 47 y.o. White female from [place] [id number].    Chief Complaint:   [name] presents today for an intake for individual therapy to address struggles related to grief over the loss of her dad in November 2023 as well as the loss of her best friend in October 2023.  Patient reports a history of panic attacks anxiety and depression.    Appetite/Sleep Problems/ADL's:    Appetite: within normal limits  Sleep problems: poor sleep quality  Compliance: taking medications as prescribed  Energy problems: decreased motivation, fatigue, and sedation    Current Status:  [name] is currently single she is divorced from her husband. She does not  have children. Patient lives in a house with self and can return to current living situation. She denies having a history of homelessness. She denies significant financial stressors or debt. She denies having ethnic/religious/cultural practices that would influence treatment.    Developmental History & Childhood Experiences (during first 18 years of life):   [name] describes childhood as good.  She reports always being close to her mom and dad.  She does state however that she feels like she has always lived her life for her parents and done what they have wanted her to do.  She also has 2 older brothers.    Family History:  Mental illness: No  Completed suicides/attempts: No  Substance abuse: No      Positive Support System:  Patient's support system includes their boyfriend. Patient is not interested in having their support system involved in their  treatment at this time.    Psychiatric Treatment History:  Current providers- N/A  History Inpatient- N/A  History Outpatient- N/A  Suicide attempts- 0  She has not been committed for involuntary hospitalization.   Primary Care Provider: Verlin Fester, NP-C, CATHY L       Medical Diagnosis and History:  Patient Active Problem List   Diagnosis    Depression with anxiety    Acquired hypothyroidism    Tobacco dependence due to cigarettes    Gastroesophageal reflux disease    Insomnia    Vitamin D deficiency    B12 deficiency    Morbid obesity with BMI of 45.0-49.9, adult (CMS HCC)    Hypertriglyceridemia    Grief    Environmental and seasonal allergies    Major depression    Generalized anxiety disorder with panic attacks     Past Medical History:   Diagnosis Date    Esophageal reflux     Insomnia     Migraine     Thyroid cancer (CMS [place]) 2018         Past Surgical History:   Procedure Laterality Date    HX CHOLECYSTECTOMY  02/20/2022    HX PARTIAL THYROIDECTOMY Right 2018    No chemo/radiation; dx thyroid cancer    HX PARTIAL THYROIDECTOMY Left 2020    no chemo/radiation    HX TONSILLECTOMY  1981       Employment:  Patient is currently working.   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      Activating Events:  She has experienced a recent loss and/or significant negative event (legal, financial, relationship, etc.) patient reports she lost her mother 5 years ago due to Alzheimer's.  She did go to therapy during this time.  She reports that she lost her dad in November due to a major and her best friend passed away in 2023/02/19 suddenly.  She reports being triggered in her home where her dad was found and states she has panic attacks.  She has some stressors around her boyfriend not liking her dog and feeling like she is to choose between her boyfriend and her dog.    Verner denies pending incarceration or homelessness.        Summary and Recommendations:  Patient starts therapy to [facility] for worsening depression. Her insight is fair and  thought process is logical. Patient's mood appears to be depressed and their affect is consistent with mood. She is Fully oriented to person, place, time and situation.  . Jeronda's current stressors include Family Stressors, Interpersonal Stressors, and Poor Radiographer, therapeutic.      Additional referrals are not expected to be made. Patient will be scheduled with outpatient follow up.        Marylene Buerger, LICSW, [id number], [id number]

## 2022-06-12 ENCOUNTER — Encounter (HOSPITAL_PSYCHIATRIC): Payer: Self-pay | Admitting: PHYSICIAN ASSISTANT

## 2022-06-12 ENCOUNTER — Other Ambulatory Visit: Payer: Self-pay

## 2022-06-12 ENCOUNTER — Ambulatory Visit: Payer: Managed Care, Other (non HMO) | Attending: PHYSICIAN ASSISTANT | Admitting: PHYSICIAN ASSISTANT

## 2022-06-12 VITALS — BP 133/76 | HR 79 | Resp 18 | Ht 65.0 in | Wt 281.0 lb

## 2022-06-12 DIAGNOSIS — F430 Acute stress reaction: Secondary | ICD-10-CM | POA: Insufficient documentation

## 2022-06-12 DIAGNOSIS — F329 Major depressive disorder, single episode, unspecified: Secondary | ICD-10-CM

## 2022-06-12 DIAGNOSIS — F4321 Adjustment disorder with depressed mood: Secondary | ICD-10-CM

## 2022-06-12 DIAGNOSIS — F411 Generalized anxiety disorder: Secondary | ICD-10-CM | POA: Insufficient documentation

## 2022-06-12 DIAGNOSIS — F410 Panic disorder [episodic paroxysmal anxiety] without agoraphobia: Secondary | ICD-10-CM | POA: Insufficient documentation

## 2022-06-12 DIAGNOSIS — Z79899 Other long term (current) drug therapy: Secondary | ICD-10-CM | POA: Insufficient documentation

## 2022-06-12 DIAGNOSIS — F332 Major depressive disorder, recurrent severe without psychotic features: Secondary | ICD-10-CM | POA: Insufficient documentation

## 2022-06-12 DIAGNOSIS — F418 Other specified anxiety disorders: Secondary | ICD-10-CM

## 2022-06-12 DIAGNOSIS — F439 Reaction to severe stress, unspecified: Secondary | ICD-10-CM | POA: Insufficient documentation

## 2022-06-12 DIAGNOSIS — Z634 Disappearance and death of family member: Secondary | ICD-10-CM | POA: Insufficient documentation

## 2022-06-12 MED ORDER — LORAZEPAM 0.5 MG TABLET
0.5000 mg | ORAL_TABLET | Freq: Three times a day (TID) | ORAL | 0 refills | Status: DC | PRN
Start: 2022-06-12 — End: 2022-07-10

## 2022-06-12 MED ORDER — ZOLPIDEM 10 MG TABLET
10.0000 mg | ORAL_TABLET | Freq: Every evening | ORAL | 0 refills | Status: DC
Start: 2022-06-12 — End: 2022-07-10

## 2022-06-12 NOTE — Progress Notes (Addendum)
Psych Evaluation-Outpatient  [place] M  Date of Birth:  04-28-1975  Date of Service:  06/12/2022    Chief Complaint: Depression and anxiety.  [id number] Lindsey Daugherty Lindsey Daugherty is a 47 y.o. female that comes into the clinic today to establish care. She has been dealing with depression since starting adult life. It used to wax and wane, but has been constant since her mother passed away i 13-Jul-2016. She had Alzheimer's Disease. She and her father took care of her full-time. She did counseling the first time after she passed away.  She has had several losses over the past several years. Her mother, her father, a woman that she thought of as a second mother that passed away in February 13, 2023.  She has a funeral to attend today for her nephew's aunt (her ex sister-in-law's sister).   She cries all day. She is experiencing sadness, hopelessness, helplessness, and worthlessness. She feels that she  "could have done more". The depression does cause changes in  her appetite that result in weight changes. She is on Ambien which did help her get to sleep , but she feels it is not strong enough.  She has low energy and suffers with amotivation. She does report anhedonia.   She worries excessively and has difficulty controlling the worry. She feels a sense of impending.   She reports no past trauma history. She has experienced trauma symptoms since losing her father in November. She still lives in the home and moved downstairs into his bedroom and the only bathroom is the one where she found him lying. The sounds of ambulances and helicopters can trigger panic attacks. She has had some nightmares that cause her to wake up in a panic. She experiences panic attacks quite frequently and has dealt with them for a long time.  She has flashbacks, intrusive thoughts, and re-experiencing the day.   Past psych history: She has never been inpatient or to outpatient clinic. She has completed therapy in the past and is scheduled with the counselor on  Valentine's Day and did find it helpful.She has been on Xanax in the past, but was able to come off of them with coping skills such as holding ice in her hands until the attack passes.  She has tried Lexapro, which caused her to sleep-eat and gained about 30 pounds. She has tried so many anti-depressants, but the Celexa is the only one with no side effects. The medications have been prescribed by her PCP.  She has been on Buspar in the past as well with no effect.   Past medical history: Thyroid cancer with thyroidectomy.   Past Medical History:   Diagnosis Date    Esophageal reflux     Insomnia     Migraine     Thyroid cancer (CMS [place]) 07-13-16      Physical Exam/ ROS: Negative except from depression and anxiety.  Family Psychiatric History: Depression, anxiety.  Family Medical History: Stroke, cancer-skin, lung, and pancreatic.  Social History: She was born and raised in [place], [place]. She has two older brothers, which are not helpful. They talk on the phone once every 2-3 months. 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 It is a good paying job and she is thankful for it. She was married for almost 5 years, but were together for 7 years. He was a good person and had lost a leg from a dog. After the amputation, they had him on  pain medicine, when they cut him off, he became a drug addict. She left the situation. 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 She had moved to [place] with her then husband, they split up, and she lived with her friend in [place]. A friend had witnessed her father fall off his tractor, so she packed up and moved back to take care of him three years ago. She lost him on November 28th, after a battle with flu, a hospitalization, and then a massive stroke attributed to sleep apnea. She has a best friend that lives five minutes away. She has no  community affiliations. She has no children. She has a boyfriend from her past with a ten year history. They got together 7-8 months.   PDMP reviewed.  PHQ-9 score: 20.  MSE: The patient is alert and oriented x4, casually dressed, fair eye contact, disheveled a bit, appearing stated age.  Speech is normal rate and tone.  Patient is somewhat talkative and appears depressed. There is no flight of ideas, loosening of associations, or tangential speech.  Not manic.  Mood is sad.  Affect congruent.  Patient does not appear to be in any acute physical distress.  No overt suicidal ideations, no homicidal ideation.  No auditory or visual hallucinations, no delusions, no paranoia.  No signs of psychosis.  no plans to harm self, no plans to harm others.  Patient is not aggressive or threatening.  No psychomotor agitation.  No psychomotor retardation.  No abnormal involuntary movements.Thoughts are linear, logical, and goal directed.  Intellectual functioning is good.  Memory is intact to recent, remote, and past events.  Patient can recall 3 of 3 objects at 0 and 5 minutes, and what was eaten for last meal.  Patient is able to provide details of current situation.  Patient can name the president, vice president, and governor.  Language is good.  Vocabulary is unimpaired, no word finding difficulty or word misuse.  Intelligence is good, patient can interpret a proverb, and reports apple and orange similarity. Calculation is unimpaired.  Concentration is good, able to recite days of week forward and backward.  Insight is good; patient is aware of their illness, how it affects their functioning, and what needs to happen for future improvement.  Judgment is good; patient is compliant with treatment and can relate appropriately to what they would do if smelling smoke in a theater, or finding stamped addressed envelope.    Diagnosis:  Axis 1: MDD-recurrent, severe without psychosis, GAD, stress reaction, and bereavement.  Axis 2:  Deferred.  Axis 3: See past medical history  Plan:   Continue Celexa 40 mg daily for mood.  Increase Ambien from 5 mg to 10 mg for sleep.  Start Ativan 0.5 mg tid/prn for anxiety/panic attacks.  Return to clinic in 4 weeks.  Urine drug screen completed today, results appropriately negative.    Time taken for dictation,  review of documents, medication reconciliation, interview the patient, required documentation, and clinical decision-making exceeded 60 minutes on the day of visit.  Fermin Schwab, PA-C

## 2022-06-12 NOTE — Patient Instructions (Signed)
Take medications as prescribed, avoid drugs and alcohol, call office if symptoms worsen or problems arise.  [phone].

## 2022-06-15 LAB — NOTES AND COMMENTS

## 2022-06-15 LAB — DRUG MONITORING, PANEL 5, W/CONFIRM, D/L ISOMERS,URINE
Amphetamines: NEGATIVE ng/mL (ref <500)
Barbiturates: NEGATIVE ng/mL (ref <300)
Benzodiazepines: NEGATIVE ng/mL (ref <100)
Cocaine Metabolite: NEGATIVE ng/mL (ref <100)
Creatinine: 119.6 mg/dL (ref >=20.0)
Marijuana Metabolite: NEGATIVE ng/mL (ref <20)
Methadone Metabolite: NEGATIVE ng/mL (ref <100)
Opiates: NEGATIVE ng/mL (ref <100)
Oxidant: NEGATIVE ug/mL (ref <200)
Oxycodone: NEGATIVE ng/mL (ref <100)
pH: 5.5 (ref 4.5–9.0)

## 2022-07-03 ENCOUNTER — Other Ambulatory Visit (RURAL_HEALTH_CENTER): Payer: Self-pay | Admitting: Family

## 2022-07-03 MED ORDER — FLUTICASONE PROPIONATE 50 MCG/ACTUATION NASAL SPRAY,SUSPENSION
1.0000 | Freq: Every day | NASAL | 1 refills | Status: AC
Start: 2022-07-03 — End: 2022-10-10

## 2022-07-10 ENCOUNTER — Ambulatory Visit: Payer: Managed Care, Other (non HMO) | Attending: Clinical | Admitting: Clinical

## 2022-07-10 ENCOUNTER — Other Ambulatory Visit: Payer: Self-pay

## 2022-07-10 ENCOUNTER — Other Ambulatory Visit (HOSPITAL_PSYCHIATRIC): Payer: Self-pay | Admitting: PHYSICIAN ASSISTANT

## 2022-07-10 DIAGNOSIS — Z634 Disappearance and death of family member: Secondary | ICD-10-CM | POA: Insufficient documentation

## 2022-07-10 DIAGNOSIS — F410 Panic disorder [episodic paroxysmal anxiety] without agoraphobia: Secondary | ICD-10-CM | POA: Insufficient documentation

## 2022-07-10 DIAGNOSIS — F329 Major depressive disorder, single episode, unspecified: Secondary | ICD-10-CM | POA: Insufficient documentation

## 2022-07-10 DIAGNOSIS — F411 Generalized anxiety disorder: Secondary | ICD-10-CM | POA: Insufficient documentation

## 2022-07-10 NOTE — Telephone Encounter (Signed)
If you don't mind to approve or deny please. Thank You

## 2022-07-10 NOTE — Psychotherapy Note (Signed)
[place]     Name: NIKHILA STONER MRN:  [id number]   Date: 07/10/2022 DOB: Feb 01, 1976     Start Time: 10:00  End Time: 11:00      SUBJECTIVE:     Kaitlan presents today for an individual therapy session to continue addressing ongoing struggles related to grief, anxiety and depression.  She spent time discussing that she and her brother are trying to figure out how to get together in order to go through their dad's belongings.  She also spent time discussing her plans when she was to move were rent her childhood home.  She spent time discussing various topics including some of her stressors with work.    OBJECTIVE:     Mood:  Stable   Affect: congruent to mood   Thought process: intrusive    ASSESSMENT:  (Z63.4) Bereavement  (primary encounter diagnosis)      (F41.1,  F41.0) Generalized anxiety disorder with panic attacks      (F32.9) Major depression        INTERVENTION/PLAN:     Counselor informed patient that she will be leaving at the end of April.  Patient was provided a list of local providers in the area as well as the option of staying with [facility] if she wants to continue therapy.  Therapist validated Shelagh's feelings while providing positive feedback. Counselor will continue to work with [employer] on developing appropriate coping skills and processing stressors. Supportive psychotherapy was utilized to encourage identification and expression of present feeling states, validate expressed feelings, and encourage self-efficacy. Therapeutic skills used: developing and using coping skills Emojean will continue to be encouraged to maintain regular attendance to scheduled appointments, identify triggers for emotional and/or behavioral relapse, and develop effective coping strategies. Genoa will return to clinic in 1 month or sooner if needed.         [address] LICSW, [id number], [id number]

## 2022-07-11 ENCOUNTER — Encounter (HOSPITAL_PSYCHIATRIC): Payer: Self-pay | Admitting: PHYSICIAN ASSISTANT

## 2022-07-11 ENCOUNTER — Ambulatory Visit: Payer: Managed Care, Other (non HMO) | Attending: PHYSICIAN ASSISTANT | Admitting: PHYSICIAN ASSISTANT

## 2022-07-11 VITALS — BP 143/89 | HR 76 | Resp 18 | Ht 64.0 in | Wt 283.0 lb

## 2022-07-11 DIAGNOSIS — F411 Generalized anxiety disorder: Secondary | ICD-10-CM | POA: Insufficient documentation

## 2022-07-11 DIAGNOSIS — F430 Acute stress reaction: Secondary | ICD-10-CM | POA: Insufficient documentation

## 2022-07-11 DIAGNOSIS — G4700 Insomnia, unspecified: Secondary | ICD-10-CM | POA: Insufficient documentation

## 2022-07-11 DIAGNOSIS — F410 Panic disorder [episodic paroxysmal anxiety] without agoraphobia: Secondary | ICD-10-CM | POA: Insufficient documentation

## 2022-07-11 DIAGNOSIS — Z634 Disappearance and death of family member: Secondary | ICD-10-CM | POA: Insufficient documentation

## 2022-07-11 DIAGNOSIS — F329 Major depressive disorder, single episode, unspecified: Secondary | ICD-10-CM | POA: Insufficient documentation

## 2022-07-11 MED ORDER — VARENICLINE TARTRATE 0.5 MG (11)-1 MG (42) TABLETS IN A DOSE PACK
ORAL_TABLET | ORAL | 0 refills | Status: DC
Start: 2022-07-11 — End: 2022-08-12

## 2022-07-11 NOTE — Progress Notes (Signed)
BEHAVIORAL MEDICINE, [facility] [place] [id number]  Operated by [facility]  Progress Note    Name: Lindsey Daugherty MRN:  [id number]   Date: 07/11/2022 DOB:  [birth year] (47 y.o.)           Chief Complaint: Major Depression, Stress Reaction, Insomnia, Bereavement and Generalized Anxiety    Subjective:   Patient reports that she is doing better. She has had some panic attacks, but they are less severe and frequent. She has a little bit of anxiety today.   Mood "better".  Medications Doing well with no side effects.   Appetite: Eating more, but still doesn't eat until after 9pm. She started July 3rd to be able to have gastric bypass, except for tobacco cessation. She would like to start Chantix.   Sleep: Better with Ambien.  Energy Doing better. She does have a dog that she takes out 4-5 times per day. She is taking care of herself better and is treating herself to a hot bath once a week.   Stressors: Boyfriend's health.     Patient Active Problem List   Diagnosis    Acquired hypothyroidism    Tobacco dependence due to cigarettes    Gastroesophageal reflux disease    Insomnia    Vitamin D deficiency    B12 deficiency    Morbid obesity with BMI of 45.0-49.9, adult (CMS HCC)    Hypertriglyceridemia    Bereavement    Environmental and seasonal allergies    Major depression    Generalized anxiety disorder with panic attacks    Stress reaction     Past Medical History:   Diagnosis Date    Esophageal reflux     Insomnia     Migraine     Thyroid cancer (CMS [place]) 2018     Past Surgical History:   Procedure Laterality Date    HX CHOLECYSTECTOMY  02/20/2022    HX PARTIAL THYROIDECTOMY Right 2018    No chemo/radiation; dx thyroid cancer    HX PARTIAL THYROIDECTOMY Left 2020    no chemo/radiation    HX TONSILLECTOMY  1981     Family History   Problem Relation Name Age of Onset    No Known Problems Mother      Diabetes type II Father      Hypertension  (High Blood Pressure) Father      Elevated Lipids Father      No Known Problems Sister      No Known Problems Brother      No Known Problems Half-Sister      No Known Problems Half-Brother      No Known Problems Maternal Aunt      No Known Problems Maternal Uncle      No Known Problems Paternal Aunt      No Known Problems Paternal Uncle      No Known Problems Maternal Grandmother      No Known Problems Maternal Grandfather      No Known Problems Paternal Grandmother      No Known Problems Paternal Grandfather      No Known Problems Daughter      No Known Problems Son       Social History     Socioeconomic History    Marital status: Divorced    Number of children: 0    Highest education level: Associate degree: academic program   Occupational History    Occupation:  Pharmaceutical   Tobacco Use    Smoking status: Every Day     Current packs/day: 0.50     Average packs/day: 0.5 packs/day for 29.2 years (14.6 ttl pk-yrs)     Types: Cigarettes     Start date: 1995    Smokeless tobacco: Never   Vaping Use    Vaping status: Never Used   Substance and Sexual Activity    Alcohol use: Never    Drug use: Never    Sexual activity: Not Currently     Partners: Male     Comment: LMP: 12/04/21 ON ORAL CONTRACEPTION; G2P0A2     Social Determinants of Health     Financial Resource Strain: Low Risk  (05/08/2022)    Financial Resource Strain     SDOH Financial: No   Transportation Needs: Low Risk  (05/08/2022)    Transportation Needs     SDOH Transportation: No   Social Connections: Low Risk  (05/08/2022)    Social Connections     SDOH Social Isolation: 5 or more times a week   Intimate Partner Violence: Low Risk  (05/08/2022)    Intimate Partner Violence     SDOH Domestic Violence: No   Housing Stability: Low Risk  (05/08/2022)    Housing Stability     SDOH Housing Situation: I have housing.     SDOH Housing Worry: No      Loratadine, Pseudoephedrine, Bactrim [sulfamethoxazole-trimethoprim], and Vitamin e (d-alpha tocopherol)   Current  Outpatient Medications   Medication Sig    albuterol sulfate (PROVENTIL OR VENTOLIN OR PROAIR) 90 mcg/actuation Inhalation oral inhaler Take 2 Puffs by inhalation Every 6 hours as needed    ascorbic acid (SUPER C PO) Take 1 Tablet by mouth Once a day Super c with zinc    citalopram (CELEXA) 40 mg Oral Tablet Take 1 Tablet (40 mg total) by mouth Once a day for 90 days    drospirenone, contraceptive, (SLYND) 4 mg (28) Oral Tablet Take 1 Tablet (4 mg total) by mouth Once a day    fluticasone propionate (FLONASE) 50 mcg/actuation Nasal Spray, Suspension Administer 1 Spray into each nostril Once a day for 90 days    furosemide (LASIX) 20 mg Oral Tablet Take 1 Tablet (20 mg total) by mouth Once per day as needed    gabapentin (NEURONTIN) 300 mg Oral Capsule Take 1 Capsule (300 mg total) by mouth Once a day    levothyroxine (SYNTHROID) 200 mcg Oral Tablet Take 1 Tablet (200 mcg total) by mouth Every morning for 90 days    LORazepam (ATIVAN) 0.5 mg Oral Tablet TAKE 1 TABLET (0.5 MG TOTAL) BY MOUTH 3 TIMES A DAY AS NEEDED FOR ANXIETY    lovastatin (MEVACOR) 20 mg Oral Tablet Take 1 Tablet (20 mg total) by mouth Every evening with dinner for 90 days    pantoprazole (PROTONIX) 40 mg Oral Tablet, Delayed Release (E.C.) Take 1 Tablet (40 mg total) by mouth Once a day for 90 days    prenatal [id number] fu/folic acid (PRENATAL COMPLETE ORAL) Take 1 Tablet by mouth Once a day    valACYclovir (VALTREX) 500 mg Oral Tablet Take 1 Tablet (500 mg total) by mouth Once a day    zolpidem (AMBIEN) 10 mg Oral Tablet TAKE 1 TABLET (10 MG TOTAL) BY MOUTH EVERY NIGHT    ZYRTEC 10 mg Oral Tablet Take 1 Tablet (10 mg total) by mouth      Objective :  BP (!) 143/89 (Site: Right, Patient  Position: Sitting)   Pulse 76   Resp 18   Ht 1.626 m (5\' 4" )   Wt 128 kg (283 lb)   BMI 48.58 kg/m       PHQ Total Score  PHQ 2 Total: 4  PHQ 9 Total: 15  Interpretation of Total Score: 15-19 Moderate/Severe depression             05/08/2022     9:56 AM 06/12/2022      9:13 AM 07/11/2022     9:43 AM   Most Recent PHQ-9 Scores   PHQ 9 Total 21 20 15         Mental Status Exam  AXOX4. Casual dress, calm, well-groomed. No SI, HI, AVH, delusions, or paranoia. Thoughts are logical, coherent, and goal-directed. Good eye contact. Speech is normal in rate and tone. Mood is "better" affect congruent. No psychomotor agitation or retardation, cogwheel rigidity, or abnormal movements. Gait is normal. Attention, concentration, and memory are good. No cognitive deficits noted. Judgment and insight are fair. Calculation and abstraction are within normal limits.     Data Reviewed  I have reviewed patient's previous note medical, surgical, family, and social history in detail today,     Assessment  Major Depression, Stress Reaction, Insomnia, Bereavement and Generalized Anxiety-improving.    Plan  Continue the following medications:  Celexa 40 mg daily for mood.  Ativan 0.5 mg tid/prn for anxiety.  Ambien 10 mg qhs for sleep.  Chantix dose pack for smoking cessation.      Follow up  Return to clinic in 4 weeks.    Fermin Schwab, PA-C

## 2022-08-07 ENCOUNTER — Ambulatory Visit (RURAL_HEALTH_CENTER): Payer: Managed Care, Other (non HMO) | Admitting: Family

## 2022-08-10 ENCOUNTER — Other Ambulatory Visit (HOSPITAL_PSYCHIATRIC): Payer: Self-pay | Admitting: PHYSICIAN ASSISTANT

## 2022-08-12 ENCOUNTER — Ambulatory Visit (HOSPITAL_PSYCHIATRIC): Payer: Managed Care, Other (non HMO) | Admitting: PHYSICIAN ASSISTANT

## 2022-08-12 ENCOUNTER — Encounter (HOSPITAL_PSYCHIATRIC): Payer: Self-pay | Admitting: PHYSICIAN ASSISTANT

## 2022-08-12 ENCOUNTER — Other Ambulatory Visit: Payer: Self-pay

## 2022-08-12 ENCOUNTER — Ambulatory Visit: Payer: Managed Care, Other (non HMO) | Attending: Clinical | Admitting: Clinical

## 2022-08-12 VITALS — BP 146/96 | HR 83 | Resp 18 | Ht 64.0 in | Wt 290.0 lb

## 2022-08-12 DIAGNOSIS — Z634 Disappearance and death of family member: Secondary | ICD-10-CM

## 2022-08-12 DIAGNOSIS — F17210 Nicotine dependence, cigarettes, uncomplicated: Secondary | ICD-10-CM

## 2022-08-12 DIAGNOSIS — Z79899 Other long term (current) drug therapy: Secondary | ICD-10-CM | POA: Insufficient documentation

## 2022-08-12 DIAGNOSIS — F411 Generalized anxiety disorder: Secondary | ICD-10-CM | POA: Insufficient documentation

## 2022-08-12 DIAGNOSIS — F329 Major depressive disorder, single episode, unspecified: Secondary | ICD-10-CM

## 2022-08-12 DIAGNOSIS — F430 Acute stress reaction: Secondary | ICD-10-CM

## 2022-08-12 DIAGNOSIS — F439 Reaction to severe stress, unspecified: Secondary | ICD-10-CM | POA: Insufficient documentation

## 2022-08-12 DIAGNOSIS — F410 Panic disorder [episodic paroxysmal anxiety] without agoraphobia: Secondary | ICD-10-CM

## 2022-08-12 NOTE — Psychotherapy Note (Signed)
[facility]    [facility]     Name: IVAN MASKELL MRN:  [id number]   Date: 08/12/2022 DOB: 11-29-1975     Start Time: 10:00  End Time: 11:00      SUBJECTIVE:     Ilo presents today for an individual therapy session to continue addressing ongoing struggles related to grief, anxiety and depression.  She spent time discussing that she and her brother are trying to figure out how to split their dad's belongings.  She also spent time discussing her plans when she was to move were rent her childhood home.  She spent time discussing various topics including some of her stressors with work.    OBJECTIVE:     Mood:  Stable   Affect: congruent to mood   Thought process: intrusive    ASSESSMENT:  (Z63.4) Bereavement  (primary encounter diagnosis)      (F41.1,  F41.0) Generalized anxiety disorder with panic attacks      (F32.9) Major depression        INTERVENTION/PLAN:     This is the last session due to provider leaving next week. She has been given a list of providers in the area.         Benjie Karvonen, LICSW, 08/12/2022, 12:45

## 2022-08-12 NOTE — Progress Notes (Signed)
Time was from 9:00- 9:55 Am

## 2022-08-12 NOTE — Patient Instructions (Signed)
Take medications as prescribed, avoid drugs and alcohol, call office if symptoms worsen or problems arise.  [phone].

## 2022-08-12 NOTE — Progress Notes (Signed)
BEHAVIORAL MEDICINE, [facility]  [address]  [place] [address]  Operated by [facility]  Progress Note    Name: Lindsey Daugherty MRN:  [id number]   Date: 08/12/2022 DOB:  [birth year] (47 y.o.)           Chief Complaint: Stress Reaction, Bereave,emt. GAD with Panic Disorder, tobacco dependance due to cigarettes, and Major Depression    Subjective:   Patient reports that she is doing that she has had some rough times. They have been trying to go through stuff at the house, which has been triggering for her. One sister-in-law sold a vase cheaply without consulting the family and it was worth far more. There was a disagreement regarding her mother's rings. She has had good days and bad days. She is trying to make the house her own. They have found it difficulty for all the siblings to get together due to distance. She has to go to the funeral of her best friend's grandmother. Her car broke down, so she is driving her boyfriend's truck. She took the Chantix for four days, it made her dizzy and she missed a couple of days and did not start it back. She did notice a difference in the urge to quit smoking. She would like to try again.  Mood: "melancholy".  Medications: Helping, especially  with the panic attacks.  Appetite: Pretty good, a little bit of weight gain due to the stress.  Sleep Doing better with the Ambien and gives her good rest.  Energy: Some days it is hard, but she pushes herself and has always been that way.   Stressors: Family, Grief.    Patient Active Problem List   Diagnosis    Acquired hypothyroidism    Tobacco dependence due to cigarettes    Gastroesophageal reflux disease    Insomnia    Vitamin D deficiency    B12 deficiency    Morbid obesity with BMI of 45.0-49.9, adult (CMS HCC)    Hypertriglyceridemia    Bereavement    Environmental and seasonal allergies    Major depression    Generalized anxiety disorder with panic attacks    Stress  reaction     Past Medical History:   Diagnosis Date    Esophageal reflux     Insomnia     Migraine     Thyroid cancer (CMS HCC) 2018     Past Surgical History:   Procedure Laterality Date    HX CHOLECYSTECTOMY  02/20/2022    HX PARTIAL THYROIDECTOMY Right 2018    No chemo/radiation; dx thyroid cancer    HX PARTIAL THYROIDECTOMY Left 2020    no chemo/radiation    HX TONSILLECTOMY  1981     Family History   Problem Relation Name Age of Onset    No Known Problems Mother      Diabetes type II Father      Hypertension (High Blood Pressure) Father      Elevated Lipids Father      No Known Problems Sister      No Known Problems Brother      No Known Problems Half-Sister      No Known Problems Half-Brother      No Known Problems Maternal Aunt      No Known Problems Maternal Uncle      No Known Problems Paternal Aunt      No Known Problems Paternal Uncle      No Known  Problems Maternal Grandmother      No Known Problems Maternal Grandfather      No Known Problems Paternal Grandmother      No Known Problems Paternal Grandfather      No Known Problems Daughter      No Known Problems Son       Social History     Socioeconomic History    Marital status: Divorced    Number of children: 0    Highest education level: Associate degree: academic program   Occupational History    Occupation: Pharmaceutical   Tobacco Use    Smoking status: Every Day     Current packs/day: 0.50     Average packs/day: 0.5 packs/day for 29.3 years (14.7 ttl pk-yrs)     Types: Cigarettes     Start date: 1995    Smokeless tobacco: Never   Vaping Use    Vaping status: Never Used   Substance and Sexual Activity    Alcohol use: Never    Drug use: Never    Sexual activity: Not Currently     Partners: Male     Comment: LMP: 12/04/21 ON ORAL CONTRACEPTION; G2P0A2     Social Determinants of Health     Financial Resource Strain: Low Risk  (05/08/2022)    Financial Resource Strain     SDOH Financial: No   Transportation Needs: Low Risk  (05/08/2022)    Transportation  Needs     SDOH Transportation: No   Social Connections: Low Risk  (05/08/2022)    Social Connections     SDOH Social Isolation: 5 or more times a week   Intimate Partner Violence: Low Risk  (05/08/2022)    Intimate Partner Violence     SDOH Domestic Violence: No   Housing Stability: Low Risk  (05/08/2022)    Housing Stability     SDOH Housing Situation: I have housing.     SDOH Housing Worry: No      Loratadine, Pseudoephedrine, Bactrim [sulfamethoxazole-trimethoprim], and Vitamin e (d-alpha tocopherol)   Current Outpatient Medications   Medication Sig    albuterol sulfate (PROVENTIL OR VENTOLIN OR PROAIR) 90 mcg/actuation Inhalation oral inhaler Take 2 Puffs by inhalation Every 6 hours as needed    ascorbic acid (SUPER C PO) Take 1 Tablet by mouth Once a day Super c with zinc    citalopram (CELEXA) 40 mg Oral Tablet Take 1 Tablet (40 mg total) by mouth Once a day for 90 days    drospirenone, contraceptive, (SLYND) 4 mg (28) Oral Tablet Take 1 Tablet (4 mg total) by mouth Once a day    fluticasone propionate (FLONASE) 50 mcg/actuation Nasal Spray, Suspension Administer 1 Spray into each nostril Once a day for 90 days    furosemide (LASIX) 20 mg Oral Tablet Take 1 Tablet (20 mg total) by mouth Once per day as needed    gabapentin (NEURONTIN) 300 mg Oral Capsule Take 1 Capsule (300 mg total) by mouth Once a day    levothyroxine (SYNTHROID) 200 mcg Oral Tablet Take 1 Tablet (200 mcg total) by mouth Every morning for 90 days    LORazepam (ATIVAN) 0.5 mg Oral Tablet TAKE 1 TABLET (0.5 MG TOTAL) BY MOUTH 3 TIMES A DAY AS NEEDED FOR ANXIETY    lovastatin (MEVACOR) 20 mg Oral Tablet Take 1 Tablet (20 mg total) by mouth Every evening with dinner for 90 days    pantoprazole (PROTONIX) 40 mg Oral Tablet, Delayed Release (E.C.) Take 1 Tablet (40 mg  total) by mouth Once a day for 90 days    prenatal 21/iron fu/folic acid (PRENATAL COMPLETE ORAL) Take 1 Tablet by mouth Once a day    valACYclovir (VALTREX) 500 mg Oral Tablet Take 1  Tablet (500 mg total) by mouth Once a day    varenicline (CHANTIX) dose pak Take as directed.    zolpidem (AMBIEN) 10 mg Oral Tablet TAKE 1 TABLET (10 MG TOTAL) BY MOUTH EVERY NIGHT    ZYRTEC 10 mg Oral Tablet Take 1 Tablet (10 mg total) by mouth      Objective :  BP (!) 146/96 (Site: Left, Patient Position: Sitting)   Pulse 83   Resp 18   Ht 1.626 m ( )   Wt 132 kg (290 lb)   BMI 49.78 kg/m       PHQ Total Score  PHQ 2 Total: 6  PHQ 9 Total: 22  Interpretation of Total Score: 20-27 Severe depression             06/12/2022     9:13 AM 07/11/2022     9:43 AM 08/12/2022     8:26 AM   Most Recent PHQ-9 Scores   PHQ 9 Total Mental Status Exam  AXOX4. Casual dress, calm, well-groomed. No SI, HI, AVH, delusions, or paranoia. Thoughts are logical, coherent, and goal-directed. Good eye contact. Speech is normal in rate and tone. Mood is "melancholy" affect congruent. No psychomotor agitation or retardation, cogwheel rigidity, or abnormal movements. Gait is normal. Attention, concentration, and memory are good. No cognitive deficits noted. Judgment and insight are fair. Calculation and abstraction are within normal limits.     Data Reviewed  I have reviewed patient's previous note medical, surgical, family, and social history in detail today,     Assessment  Stress Reaction, Bereave,emt. GAD with Panic Disorder, and Major Depression    Plan  Continue the following medications:  Celexa 40 mg daily for mood.  Ativan 0.5 mg tid/prn for anxiety.  Ambien 10 mg qhs for sleep.  Chantix dose pack for smoking cessation.      Follow up  Return to clinic in 4 weeks.    Maren Beach, PA-C

## 2022-08-12 NOTE — Telephone Encounter (Signed)
Patient seen today. If you don't mind to approve or deny. Thank You

## 2022-08-23 ENCOUNTER — Encounter (RURAL_HEALTH_CENTER): Payer: Self-pay | Admitting: Family

## 2022-08-23 ENCOUNTER — Other Ambulatory Visit: Payer: Self-pay

## 2022-08-23 ENCOUNTER — Ambulatory Visit: Payer: Managed Care, Other (non HMO) | Attending: Family | Admitting: Family

## 2022-08-23 ENCOUNTER — Ambulatory Visit (RURAL_HEALTH_CENTER): Payer: Managed Care, Other (non HMO) | Attending: Family | Admitting: Family

## 2022-08-23 VITALS — BP 131/76 | HR 70 | Temp 98.9°F | Resp 18 | Ht 64.0 in | Wt 292.1 lb

## 2022-08-23 DIAGNOSIS — L309 Dermatitis, unspecified: Secondary | ICD-10-CM | POA: Insufficient documentation

## 2022-08-23 DIAGNOSIS — G4733 Obstructive sleep apnea (adult) (pediatric): Secondary | ICD-10-CM | POA: Insufficient documentation

## 2022-08-23 DIAGNOSIS — E781 Pure hyperglyceridemia: Secondary | ICD-10-CM | POA: Insufficient documentation

## 2022-08-23 DIAGNOSIS — R42 Dizziness and giddiness: Secondary | ICD-10-CM | POA: Insufficient documentation

## 2022-08-23 DIAGNOSIS — N760 Acute vaginitis: Secondary | ICD-10-CM

## 2022-08-23 DIAGNOSIS — M2550 Pain in unspecified joint: Secondary | ICD-10-CM | POA: Insufficient documentation

## 2022-08-23 DIAGNOSIS — M549 Dorsalgia, unspecified: Secondary | ICD-10-CM | POA: Insufficient documentation

## 2022-08-23 DIAGNOSIS — N898 Other specified noninflammatory disorders of vagina: Secondary | ICD-10-CM

## 2022-08-23 DIAGNOSIS — N899 Noninflammatory disorder of vagina, unspecified: Secondary | ICD-10-CM | POA: Insufficient documentation

## 2022-08-23 DIAGNOSIS — K219 Gastro-esophageal reflux disease without esophagitis: Secondary | ICD-10-CM | POA: Insufficient documentation

## 2022-08-23 DIAGNOSIS — Z6841 Body Mass Index (BMI) 40.0 and over, adult: Secondary | ICD-10-CM

## 2022-08-23 DIAGNOSIS — D34 Benign neoplasm of thyroid gland: Secondary | ICD-10-CM | POA: Insufficient documentation

## 2022-08-23 DIAGNOSIS — Z23 Encounter for immunization: Secondary | ICD-10-CM | POA: Insufficient documentation

## 2022-08-23 DIAGNOSIS — F17200 Nicotine dependence, unspecified, uncomplicated: Secondary | ICD-10-CM | POA: Insufficient documentation

## 2022-08-23 DIAGNOSIS — Z800 Family history of malignant neoplasm of digestive organs: Secondary | ICD-10-CM | POA: Insufficient documentation

## 2022-08-23 DIAGNOSIS — E538 Deficiency of other specified B group vitamins: Secondary | ICD-10-CM

## 2022-08-23 DIAGNOSIS — N920 Excessive and frequent menstruation with regular cycle: Secondary | ICD-10-CM | POA: Insufficient documentation

## 2022-08-23 DIAGNOSIS — Z8249 Family history of ischemic heart disease and other diseases of the circulatory system: Secondary | ICD-10-CM

## 2022-08-23 DIAGNOSIS — F5104 Psychophysiologic insomnia: Secondary | ICD-10-CM

## 2022-08-23 DIAGNOSIS — G4730 Sleep apnea, unspecified: Secondary | ICD-10-CM | POA: Insufficient documentation

## 2022-08-23 DIAGNOSIS — J3089 Other allergic rhinitis: Secondary | ICD-10-CM

## 2022-08-23 DIAGNOSIS — N946 Dysmenorrhea, unspecified: Secondary | ICD-10-CM

## 2022-08-23 DIAGNOSIS — F410 Panic disorder [episodic paroxysmal anxiety] without agoraphobia: Secondary | ICD-10-CM | POA: Insufficient documentation

## 2022-08-23 DIAGNOSIS — M25519 Pain in unspecified shoulder: Secondary | ICD-10-CM | POA: Insufficient documentation

## 2022-08-23 DIAGNOSIS — Z01419 Encounter for gynecological examination (general) (routine) without abnormal findings: Secondary | ICD-10-CM | POA: Insufficient documentation

## 2022-08-23 DIAGNOSIS — E042 Nontoxic multinodular goiter: Secondary | ICD-10-CM | POA: Insufficient documentation

## 2022-08-23 DIAGNOSIS — E039 Hypothyroidism, unspecified: Secondary | ICD-10-CM | POA: Insufficient documentation

## 2022-08-23 DIAGNOSIS — E559 Vitamin D deficiency, unspecified: Secondary | ICD-10-CM

## 2022-08-23 DIAGNOSIS — Z7689 Persons encountering health services in other specified circumstances: Secondary | ICD-10-CM

## 2022-08-23 DIAGNOSIS — F17210 Nicotine dependence, cigarettes, uncomplicated: Secondary | ICD-10-CM

## 2022-08-23 DIAGNOSIS — F411 Generalized anxiety disorder: Secondary | ICD-10-CM

## 2022-08-23 DIAGNOSIS — R0683 Snoring: Secondary | ICD-10-CM

## 2022-08-23 DIAGNOSIS — E041 Nontoxic single thyroid nodule: Secondary | ICD-10-CM | POA: Insufficient documentation

## 2022-08-23 DIAGNOSIS — D508 Other iron deficiency anemias: Secondary | ICD-10-CM

## 2022-08-23 DIAGNOSIS — Z789 Other specified health status: Secondary | ICD-10-CM

## 2022-08-23 DIAGNOSIS — R9389 Abnormal findings on diagnostic imaging of other specified body structures: Secondary | ICD-10-CM | POA: Insufficient documentation

## 2022-08-23 DIAGNOSIS — D509 Iron deficiency anemia, unspecified: Secondary | ICD-10-CM | POA: Insufficient documentation

## 2022-08-23 HISTORY — DX: Other specified noninflammatory disorders of vagina: N89.8

## 2022-08-23 HISTORY — DX: Family history of ischemic heart disease and other diseases of the circulatory system: Z82.49

## 2022-08-23 HISTORY — DX: Family history of malignant neoplasm of digestive organs: Z80.0

## 2022-08-23 HISTORY — DX: Acute vaginitis: N76.0

## 2022-08-23 HISTORY — DX: Dermatitis, unspecified: L30.9

## 2022-08-23 HISTORY — DX: Dizziness and giddiness: R42

## 2022-08-23 HISTORY — DX: Snoring: R06.83

## 2022-08-23 HISTORY — DX: Dysmenorrhea, unspecified: N94.6

## 2022-08-23 LAB — COMPREHENSIVE METABOLIC PNL, FASTING
ALBUMIN/GLOBULIN RATIO: 1.1 (ref 0.8–1.4)
ALBUMIN: 3.9 g/dL (ref 3.5–5.7)
ALKALINE PHOSPHATASE: 42 U/L (ref 34–104)
ALT (SGPT): 8 U/L (ref 7–52)
ANION GAP: 5 mmol/L (ref 4–13)
AST (SGOT): 12 U/L — ABNORMAL LOW (ref 13–39)
BILIRUBIN TOTAL: 0.4 mg/dL (ref 0.3–1.2)
BUN/CREA RATIO: 17 (ref 6–22)
BUN: 14 mg/dL (ref 7–25)
CALCIUM, CORRECTED: 9 mg/dL (ref 8.9–10.8)
CALCIUM: 8.9 mg/dL (ref 8.6–10.3)
CHLORIDE: 105 mmol/L (ref 98–107)
CO2 TOTAL: 26 mmol/L (ref 21–31)
CREATININE: 0.84 mg/dL (ref 0.60–1.30)
ESTIMATED GFR: 86 mL/min/{1.73_m2} (ref >59)
GLOBULIN: 3.4 (ref 2.9–5.4)
GLUCOSE: 94 mg/dL (ref 74–109)
OSMOLALITY, CALCULATED: 272 mOsm/kg (ref 270–290)
POTASSIUM: 4.4 mmol/L (ref 3.5–5.1)
PROTEIN TOTAL: 7.3 g/dL (ref 6.4–8.9)
SODIUM: 136 mmol/L (ref 136–145)

## 2022-08-23 LAB — IRON TRANSFERRIN AND TIBC
IRON (TRANSFERRIN) SATURATION: 16 % (ref 15–50)
IRON: 50 ug/dL (ref 50–212)
TOTAL IRON BINDING CAPACITY: 316 ug/dL (ref 250–450)
TRANSFERRIN: 226 mg/dL (ref 203–362)
UIBC: 266 ug/dL (ref 130–375)

## 2022-08-23 LAB — CBC
HCT: 39.8 % (ref 31.2–41.9)
HGB: 13.8 g/dL (ref 10.9–14.3)
MCH: 29.3 pg (ref 24.7–32.8)
MCHC: 34.6 g/dL (ref 32.3–35.6)
MCV: 84.9 fL (ref 75.5–95.3)
MPV: 10.1 fL (ref 7.9–10.8)
PLATELETS: 200 10*3/uL (ref 140–440)
RBC: 4.69 10*6/uL (ref 3.63–4.92)
RDW: 12.4 % (ref 12.3–17.7)
WBC: 7.5 10*3/uL (ref 3.8–11.8)

## 2022-08-23 LAB — LIPID PANEL
CHOL/HDL RATIO: 4.9
CHOLESTEROL: 156 mg/dL (ref <200)
HDL CHOL: 32 mg/dL — ABNORMAL LOW (ref >=40)
LDL CALC: 87 mg/dL (ref 0–100)
TRIGLYCERIDES: 183 mg/dL — ABNORMAL HIGH (ref <=150)
VLDL CALC: 37 mg/dL (ref 0–50)

## 2022-08-23 LAB — MAGNESIUM: MAGNESIUM: 2.2 mg/dL (ref 1.9–2.7)

## 2022-08-23 LAB — VITAMIN D 25 TOTAL: VITAMIN D: 40 ng/mL (ref 30–100)

## 2022-08-23 LAB — FERRITIN: FERRITIN: 45 ng/mL (ref 11–336)

## 2022-08-23 LAB — THYROID STIMULATING HORMONE (SENSITIVE TSH): TSH: 0.869 u[IU]/mL (ref 0.450–5.330)

## 2022-08-23 LAB — VITAMIN B12: VITAMIN B 12: 303 pg/mL (ref 180–914)

## 2022-08-23 MED ORDER — PANTOPRAZOLE 40 MG TABLET,DELAYED RELEASE
40.0000 mg | DELAYED_RELEASE_TABLET | Freq: Every day | ORAL | 1 refills | Status: DC
Start: 2022-08-23 — End: 2023-04-07

## 2022-08-23 MED ORDER — CITALOPRAM 40 MG TABLET
40.0000 mg | ORAL_TABLET | Freq: Every day | ORAL | 1 refills | Status: DC
Start: 2022-08-23 — End: 2022-10-10

## 2022-08-23 MED ORDER — LEVOTHYROXINE 200 MCG TABLET
200.0000 ug | ORAL_TABLET | Freq: Every morning | ORAL | 1 refills | Status: DC
Start: 2022-08-23 — End: 2023-05-28

## 2022-08-23 MED ORDER — ALBUTEROL SULFATE HFA 90 MCG/ACTUATION AEROSOL INHALER
2.0000 | INHALATION_SPRAY | Freq: Four times a day (QID) | RESPIRATORY_TRACT | 0 refills | Status: AC | PRN
Start: 2022-08-23 — End: 2022-09-22

## 2022-08-23 MED ORDER — LOVASTATIN 20 MG TABLET
20.0000 mg | ORAL_TABLET | Freq: Every evening | ORAL | 1 refills | Status: DC
Start: 2022-08-23 — End: 2023-05-28

## 2022-08-23 NOTE — Assessment & Plan Note (Signed)
Symptoms well controlled with Protonix 40 mg daily.  Continue current medications.

## 2022-08-23 NOTE — Progress Notes (Signed)
FAMILY MEDICINE, [facility] FAMILY MEDICINE [facility]  [address]  [place] [address]  Operated by [facility]     Name: Lindsey Daugherty MRN:  [id number]   Date of Birth: 1975-06-23 Age: 47 y.o.   Date: 08/23/2022  Time: 10:04     Provider: Stormy Fabian, NP-C    Reason for visit: Follow Up (3 month follow-up and medication refills. )      History of Present Illness:  Lindsey Daugherty is a 47 y.o. female presenting with chronic disease management.        Patient Active Problem List    Diagnosis Date Noted    Vaginal odor 08/23/2022    Panic disorder without agoraphobia 08/23/2022    Obstructive sleep apnea syndrome 08/23/2022    Normal gynecologic examination 08/23/2022    Nontoxic single thyroid nodule 08/23/2022    Noninflammatory disorder of vagina 08/23/2022    Non-toxic multinodular goiter 08/23/2022    Nicotine dependence 08/23/2022    Need for immunization against influenza 08/23/2022    Menorrhagia 08/23/2022     [name], GYN      Family history of malignant neoplasm of digestive organ 08/23/2022    Dermatitis, unspecified 08/23/2022    Dysmenorrhea 08/23/2022    Dizziness and giddiness 08/23/2022    Benign neoplasm of thyroid gland 08/23/2022    Back pain 08/23/2022    Arthralgia of shoulder 08/23/2022    Iron deficiency anemia 08/23/2022    Abnormal findings on diagnostic imaging of other specified body structures 08/23/2022    Weight loss advised 08/23/2022    Encounter for weight management 08/23/2022    Stress reaction 06/12/2022    Major depression 06/05/2022    Generalized anxiety disorder with panic attacks 06/05/2022     Managed by [facility]      Bereavement 05/08/2022    Environmental and seasonal allergies 05/08/2022    Hypertriglyceridemia 03/25/2022    Acquired hypothyroidism 09/19/2021    Tobacco dependence due to cigarettes 09/19/2021    Gastroesophageal reflux disease without esophagitis 09/19/2021    Vitamin D deficiency 09/19/2021     B12 deficiency 09/19/2021    Morbid obesity with BMI of 50.0-59.9, adult (CMS HCC) 09/19/2021    Insomnia 06/11/2021     Managed by [facility]         Historical Data    Past Medical History:  Past Medical History:   Diagnosis Date    Acute vaginitis 08/23/2022    Edema of lower extremity 07/24/2021    Esophageal reflux     Family history of cardiovascular disease 08/23/2022    Insomnia     Lightheadedness 08/23/2022    Migraine     Snoring 08/23/2022    Thyroid cancer (CMS HCC) 2018     Past Surgical History:  Past Surgical History:   Procedure Laterality Date    HX CHOLECYSTECTOMY  02/20/2022    HX PARTIAL THYROIDECTOMY Right 2018    No chemo/radiation; dx thyroid cancer    HX PARTIAL THYROIDECTOMY Left 2020    no chemo/radiation    HX TONSILLECTOMY  1981     Allergies:  Allergies   Allergen Reactions    Loratadine Shortness of Breath and  Other Adverse Reaction (Add comment)     claritan D    Pseudoephedrine Shortness of Breath    Sulfamethoxazole-Trimethoprim Itching and Hives/ Urticaria    Vitamin E Rash    Vitamin E-Safflower Oil  Vitamin E (D-Alpha Tocopherol) Itching     Medications:  Current Outpatient Medications   Medication Sig    albuterol sulfate (PROVENTIL OR VENTOLIN OR PROAIR) 90 mcg/actuation Inhalation oral inhaler Take 2 Puffs by inhalation Every 6 hours as needed for up to 30 days    ascorbic acid (SUPER C PO) Take 1 Tablet by mouth Once a day Super c with zinc    citalopram (CELEXA) 40 mg Oral Tablet Take 1 Tablet (40 mg total) by mouth Once a day for 90 days    drospirenone, contraceptive, (SLYND) 4 mg (28) Oral Tablet Take 1 Tablet (4 mg total) by mouth Once a day    fluticasone propionate (FLONASE) 50 mcg/actuation Nasal Spray, Suspension Administer 1 Spray into each nostril Once a day for 90 days    furosemide (LASIX) 20 mg Oral Tablet Take 1 Tablet (20 mg total) by mouth Once per day as needed    gabapentin (NEURONTIN) 300 mg Oral Capsule Take 1 Capsule (300 mg total) by  mouth Once a day    levothyroxine (SYNTHROID) 200 mcg Oral Tablet Take 1 Tablet (200 mcg total) by mouth Every morning for 90 days    LORazepam (ATIVAN) 0.5 mg Oral Tablet TAKE 1 TABLET BY MOUTH 3 TIMES A DAY AS NEEDED FOR ANXIETY.    lovastatin (MEVACOR) 20 mg Oral Tablet Take 1 Tablet (20 mg total) by mouth Every evening with dinner for 90 days    pantoprazole (PROTONIX) 40 mg Oral Tablet, Delayed Release (E.C.) Take 1 Tablet (40 mg total) by mouth Once a day for 90 days    prenatal 21/iron fu/folic acid (PRENATAL COMPLETE ORAL) Take 1 Tablet by mouth Once a day    valACYclovir (VALTREX) 500 mg Oral Tablet Take 1 Tablet (500 mg total) by mouth Once a day    varenicline (CHANTIX) dose pak TAKE AS DIRECTED.    zolpidem (AMBIEN) 10 mg Oral Tablet TAKE 1 TABLET BY MOUTH EVERY NIGHT    ZYRTEC 10 mg Oral Tablet Take 1 Tablet (10 mg total) by mouth     Family History:  Family Medical History:       Problem Relation (Age of Onset)    Diabetes type II Father    Elevated Lipids Father    Hypertension (High Blood Pressure) Father    No Known Problems Mother, Sister, Brother, Half-Sister, Half-Brother, Maternal Aunt, Maternal Uncle, Paternal Aunt, Paternal Uncle, Maternal Grandmother, Maternal Grandfather, Paternal Grandmother, Paternal Grandfather, Daughter, Son            Social History:  Social History     Socioeconomic History    Marital status: Divorced    Number of children: 0    Highest education level: Associate degree: academic program   Occupational History    Occupation: Pharmaceutical   Tobacco Use    Smoking status: Every Day     Current packs/day: 0.50     Average packs/day: 0.5 packs/day for 29.3 years (14.7 ttl pk-yrs)     Types: Cigarettes     Start date: 1995    Smokeless tobacco: Never   Vaping Use    Vaping status: Never Used   Substance and Sexual Activity    Alcohol use: Never    Drug use: Never    Sexual activity: Not Currently     Partners: Male     Comment: LMP: 12/04/21 ON ORAL CONTRACEPTION; G2P0A2      Social Determinants of Health     Financial Resource Strain: Low Risk  (  05/08/2022)    Financial Resource Strain     SDOH Financial: No   Transportation Needs: Low Risk  (05/08/2022)    Transportation Needs     SDOH Transportation: No   Social Connections: Low Risk  (05/08/2022)    Social Connections     SDOH Social Isolation: 5 or more times a week   Intimate Partner Violence: Low Risk  (05/08/2022)    Intimate Partner Violence     SDOH Domestic Violence: No   Housing Stability: Low Risk  (05/08/2022)    Housing Stability     SDOH Housing Situation: I have housing.     SDOH Housing Worry: No           Review of Systems:  Any pertinent Review of Systems as addressed in the HPI above.    Physical Exam:  Vital Signs:  Vitals:    08/23/22 0904   BP: 131/76   Pulse: 70   Resp: 18   Temp: 37.2 C (98.9 F)   TempSrc: Temporal   SpO2: 100%   Weight: 133 kg (292 lb 2 oz)   Height: 1.626 m (5\' 4" )   BMI: 50.25     Physical Exam  Vitals reviewed.   Constitutional:       Appearance: Normal appearance. She is obese.   HENT:      Head: Normocephalic.      Mouth/Throat:      Pharynx: Oropharynx is clear.   Eyes:      Extraocular Movements: Extraocular movements intact.      Conjunctiva/sclera: Conjunctivae normal.      Pupils: Pupils are equal, round, and reactive to light.   Neck:      Thyroid: No thyroid mass, thyromegaly or thyroid tenderness.   Cardiovascular:      Rate and Rhythm: Normal rate and regular rhythm.      Pulses: Normal pulses.      Heart sounds: Normal heart sounds, S1 normal and S2 normal.   Pulmonary:      Effort: Pulmonary effort is normal.      Breath sounds: Normal breath sounds.   Abdominal:      General: Bowel sounds are normal.      Palpations: Abdomen is soft.   Musculoskeletal:         General: Normal range of motion.      Cervical back: Normal range of motion and neck supple.      Right lower leg: No edema.      Left lower leg: No edema.   Skin:     General: Skin is warm and dry.   Neurological:       General: No focal deficit present.      Mental Status: She is alert and oriented to person, place, and time. Mental status is at baseline.      Cranial Nerves: Cranial nerves 2-12 are intact.      Motor: Motor function is intact.      Coordination: Coordination is intact.      Gait: Gait is intact.   Psychiatric:         Mood and Affect: Mood normal.         Behavior: Behavior normal. Behavior is cooperative.         Thought Content: Thought content normal.         Cognition and Memory: Cognition normal.         Judgment: Judgment normal.  Assessment/Plan:  (G47.33) Obstructive sleep apnea syndrome  (primary encounter diagnosis)  Plan: CBC, COMPREHENSIVE METABOLIC PNL, FASTING,         LIPID PANEL, MAGNESIUM, THYROID STIMULATING         HORMONE (SENSITIVE TSH), VITAMIN D 25 TOTAL,         VITAMIN B12, FERRITIN, IRON TRANSFERRIN AND         TIBC, UNATTENDED HOME SLEEP STUDY    (E78.1) Hypertriglyceridemia  Plan: CBC, COMPREHENSIVE METABOLIC PNL, FASTING,         LIPID PANEL, MAGNESIUM, THYROID STIMULATING         HORMONE (SENSITIVE TSH), VITAMIN D 25 TOTAL,         VITAMIN B12, FERRITIN, IRON TRANSFERRIN AND         TIBC    (K21.9) Gastroesophageal reflux disease without esophagitis  Plan: CBC, COMPREHENSIVE METABOLIC PNL, FASTING,         LIPID PANEL, MAGNESIUM, THYROID STIMULATING         HORMONE (SENSITIVE TSH), VITAMIN D 25 TOTAL,         VITAMIN B12, FERRITIN, IRON TRANSFERRIN AND         TIBC    (E03.9) Acquired hypothyroidism  Plan: CBC, COMPREHENSIVE METABOLIC PNL, FASTING,         LIPID PANEL, MAGNESIUM, THYROID STIMULATING         HORMONE (SENSITIVE TSH), VITAMIN D 25 TOTAL,         VITAMIN B12, FERRITIN, IRON TRANSFERRIN AND         TIBC    (E53.8) B12 deficiency  Plan: CBC, COMPREHENSIVE METABOLIC PNL, FASTING,         LIPID PANEL, MAGNESIUM, THYROID STIMULATING         HORMONE (SENSITIVE TSH), VITAMIN D 25 TOTAL,         VITAMIN B12, FERRITIN, IRON TRANSFERRIN AND         TIBC    (E55.9) Vitamin  D deficiency  Plan: CBC, COMPREHENSIVE METABOLIC PNL, FASTING,         LIPID PANEL, MAGNESIUM, THYROID STIMULATING         HORMONE (SENSITIVE TSH), VITAMIN D 25 TOTAL,         VITAMIN B12, FERRITIN, IRON TRANSFERRIN AND         TIBC    (F17.210) Tobacco dependence due to cigarettes  Plan: CBC, COMPREHENSIVE METABOLIC PNL, FASTING,         LIPID PANEL, MAGNESIUM, THYROID STIMULATING         HORMONE (SENSITIVE TSH), VITAMIN D 25 TOTAL,         VITAMIN B12, FERRITIN, IRON TRANSFERRIN AND         TIBC    (J30.89) Environmental and seasonal allergies  Plan: CBC, COMPREHENSIVE METABOLIC PNL, FASTING,         LIPID PANEL, MAGNESIUM, THYROID STIMULATING         HORMONE (SENSITIVE TSH), VITAMIN D 25 TOTAL,         VITAMIN B12, FERRITIN, IRON TRANSFERRIN AND         TIBC    (Z78.9) Weight loss advised    (Z76.89) Encounter for weight management    (E66.01,  Z68.43) Morbid obesity with BMI of 50.0-59.9, adult (CMS HCC)  Plan: UNATTENDED HOME SLEEP STUDY    (D50.8) Iron deficiency anemia secondary to inadequate dietary iron intake    (F41.1,  F41.0) Generalized anxiety disorder with panic attacks    (F51.04) Psychophysiological insomnia  Problem List Items Addressed This Visit          Cardiovascular System    Hypertriglyceridemia     Continue lovastatin 20 mg daily to lower triglycerides levels.  Advise to limit high fat foods, processed foods and fast foods in diet.  Discussed side effects.  Rechecked labs in 3 months.         Relevant Orders    CBC    COMPREHENSIVE METABOLIC PNL, FASTING    LIPID PANEL    MAGNESIUM    THYROID STIMULATING HORMONE (SENSITIVE TSH)    VITAMIN D 25 TOTAL    VITAMIN B12    FERRITIN    IRON TRANSFERRIN AND TIBC       Respiratory    Obstructive sleep apnea syndrome - Primary     Home Sleep Study ordered.         Relevant Orders    CBC    COMPREHENSIVE METABOLIC PNL, FASTING    LIPID PANEL    MAGNESIUM    THYROID STIMULATING HORMONE (SENSITIVE TSH)    VITAMIN D 25 TOTAL    VITAMIN B12     FERRITIN    IRON TRANSFERRIN AND TIBC    UNATTENDED HOME SLEEP STUDY       Neurologic    Insomnia       Digestive    Gastroesophageal reflux disease without esophagitis     Symptoms well controlled with Protonix 40 mg daily.  Continue current medications.         Relevant Orders    CBC    COMPREHENSIVE METABOLIC PNL, FASTING    LIPID PANEL    MAGNESIUM    THYROID STIMULATING HORMONE (SENSITIVE TSH)    VITAMIN D 25 TOTAL    VITAMIN B12    FERRITIN    IRON TRANSFERRIN AND TIBC       Endocrine    Acquired hypothyroidism     Continue levothyroxine 200 mcg daily.  Patient denies missed doses.  Advised to take levothyroxine in the morning on an empty stomach and wait 30 minutes prior to eating/drinking.         Relevant Orders    CBC    COMPREHENSIVE METABOLIC PNL, FASTING    LIPID PANEL    MAGNESIUM    THYROID STIMULATING HORMONE (SENSITIVE TSH)    VITAMIN D 25 TOTAL    VITAMIN B12    FERRITIN    IRON TRANSFERRIN AND TIBC    Vitamin D deficiency     Advised vitamin-D 4000 IU daily.         Relevant Orders    CBC    COMPREHENSIVE METABOLIC PNL, FASTING    LIPID PANEL    MAGNESIUM    THYROID STIMULATING HORMONE (SENSITIVE TSH)    VITAMIN D 25 TOTAL    VITAMIN B12    FERRITIN    IRON TRANSFERRIN AND TIBC    B12 deficiency     Continue vitamin B12 1000 mcg daily.         Relevant Orders    CBC    COMPREHENSIVE METABOLIC PNL, FASTING    LIPID PANEL    MAGNESIUM    THYROID STIMULATING HORMONE (SENSITIVE TSH)    VITAMIN D 25 TOTAL    VITAMIN B12    FERRITIN    IRON TRANSFERRIN AND TIBC       Hematologic/Lymphatic    Iron deficiency anemia     Further treatment pending labs.  Psychiatric    Tobacco dependence due to cigarettes     Patient is currently taking Chantix, prescribed by Dr Massie Maroon, [facility].          Relevant Orders    CBC    COMPREHENSIVE METABOLIC PNL, FASTING    LIPID PANEL    MAGNESIUM    THYROID STIMULATING HORMONE (SENSITIVE TSH)    VITAMIN D 25 TOTAL    VITAMIN B12    FERRITIN    IRON  TRANSFERRIN AND TIBC    Generalized anxiety disorder with panic attacks       Other    Morbid obesity with BMI of 50.0-59.9, adult (CMS HCC)     Advised a low-fat/low-sodium diet, advised 150 minutes of scheduled weekly physical activity as tolerated continue with weight loss efforts.  Patient has a scheduled gastric bypass at [facility] scheduled once she is nicotine free x2 months.         Relevant Orders    UNATTENDED HOME SLEEP STUDY    Environmental and seasonal allergies    Relevant Orders    CBC    COMPREHENSIVE METABOLIC PNL, FASTING    LIPID PANEL    MAGNESIUM    THYROID STIMULATING HORMONE (SENSITIVE TSH)    VITAMIN D 25 TOTAL    VITAMIN B12    FERRITIN    IRON TRANSFERRIN AND TIBC    Weight loss advised    Encounter for weight management     Patient is under the care Dr. Suszanne Finch at [facility] for gastric bypass surgery.  Patient is currently taking Chantix and must be tobacco free for 2 months prior to medical clearance for gastric bypass surgery.             Further treatment pending labs.  Continue current medications.  Advised a low-fat/low sodium diet, advised 150 minutes of scheduled weekly physical activity as tolerated.  Advised to maintain a healthy weight.     Return in about 3 months (around 11/23/2022) for routine visit.    Stormy Fabian, NP-C     Portions of this note may be dictated using voice recognition software or a dictation service. Variances in spelling and vocabulary are possible and unintentional. Not all errors are caught/corrected. Please notify the Thereasa Parkin if any discrepancies are noted or if the meaning of any statement is not clear.

## 2022-08-23 NOTE — Assessment & Plan Note (Signed)
Advised a low-fat/low-sodium diet, advised 150 minutes of scheduled weekly physical activity as tolerated continue with weight loss efforts.  Patient has a scheduled gastric bypass at [facility] scheduled once she is nicotine free x2 months.

## 2022-08-23 NOTE — Assessment & Plan Note (Signed)
Patient is under the care Dr. Suszanne Finch at [facility] for gastric bypass surgery.  Patient is currently taking Chantix and must be tobacco free for 2 months prior to medical clearance for gastric bypass surgery.

## 2022-08-23 NOTE — Assessment & Plan Note (Signed)
Home Sleep Study ordered 

## 2022-08-23 NOTE — Assessment & Plan Note (Signed)
Patient is currently taking Chantix, prescribed by Dr Massie Maroon, [facility] [place].

## 2022-08-23 NOTE — Assessment & Plan Note (Signed)
Continue vitamin B12 1000 mcg daily.

## 2022-08-23 NOTE — Assessment & Plan Note (Signed)
Continue levothyroxine 200 mcg daily.  Patient denies missed doses.  Advised to take levothyroxine in the morning on an empty stomach and wait 30 minutes prior to eating/drinking.

## 2022-08-23 NOTE — Assessment & Plan Note (Signed)
Continue lovastatin 20 mg daily to lower triglycerides levels.  Advise to limit high fat foods, processed foods and fast foods in diet.  Discussed side effects.  Rechecked labs in 3 months.

## 2022-08-23 NOTE — Assessment & Plan Note (Signed)
Further treatment pending labs.

## 2022-08-23 NOTE — Nursing Note (Signed)
Patient here today for 3 month follow-up and medication refills. Patient states no new concerns at this time.

## 2022-08-23 NOTE — Assessment & Plan Note (Signed)
Advised vitamin-D 4000 IU daily.

## 2022-09-02 ENCOUNTER — Inpatient Hospital Stay
Admission: RE | Admit: 2022-09-02 | Discharge: 2022-09-02 | Disposition: A | Discharge status: Home / Self Care | Payer: Managed Care, Other (non HMO) | Source: Ambulatory Visit | Attending: Family | Admitting: Family

## 2022-09-02 ENCOUNTER — Other Ambulatory Visit: Payer: Self-pay

## 2022-09-02 DIAGNOSIS — Z6841 Body Mass Index (BMI) 40.0 and over, adult: Secondary | ICD-10-CM | POA: Insufficient documentation

## 2022-09-02 DIAGNOSIS — G4733 Obstructive sleep apnea (adult) (pediatric): Secondary | ICD-10-CM

## 2022-09-03 NOTE — Procedures (Signed)
[facility] Sleep Study    Patient Name: Lindsey Daugherty  Date of Birth: 1975/04/25  Gender: female  Provider: Lubertha Sayres  Location: [facility]  Location Address: [address] [place], [place] [address]  Location Phone: [phone]      Primary Care Provider: Stormy Fabian, NP-C    Procedure Home Sleep Test     Past Medical History  Past Medical History:   Diagnosis Date    Acute vaginitis 08/23/2022    Edema of lower extremity 07/24/2021    Esophageal reflux     Family history of cardiovascular disease 08/23/2022    Insomnia     Lightheadedness 08/23/2022    Migraine     Snoring 08/23/2022    Thyroid cancer (CMS HCC) 2018           Past Surgical History  Past Surgical History:   Procedure Laterality Date    HX CHOLECYSTECTOMY  02/20/2022    HX PARTIAL THYROIDECTOMY Right 2018    No chemo/radiation; dx thyroid cancer    HX PARTIAL THYROIDECTOMY Left 2020    no chemo/radiation    HX TONSILLECTOMY  1981           Medication LIst  No outpatient medications have been marked as taking for the 09/02/22 encounter [facility] Encounter) with SLEEP LAB RM 1 PRN.       Allergy List  Allergies   Allergen Reactions    Loratadine Shortness of Breath and  Other Adverse Reaction (Add comment)     claritan D    Pseudoephedrine Shortness of Breath    Sulfamethoxazole-Trimethoprim Itching and Hives/ Urticaria    Vitamin E Rash    Vitamin E-Safflower Oil     Vitamin E (D-Alpha Tocopherol) Itching       Family Medical History  Family Medical History:       Problem Relation (Age of Onset)    Diabetes type II Father    Elevated Lipids Father    Hypertension (High Blood Pressure) Father    No Known Problems Mother, Sister, Brother, Half-Sister, Half-Brother, Maternal Aunt, Maternal Uncle, Paternal Aunt, Paternal Uncle, Maternal Grandmother, Maternal Grandfather, Paternal Grandmother, Paternal Grandfather, Daughter, Son              Social History  Social History     Socioeconomic History     Marital status: Divorced    Number of children: 0    Highest education level: Associate degree: academic program   Occupational History    Occupation: Pharmaceutical   Tobacco Use    Smoking status: Every Day     Current packs/day: 0.50     Average packs/day: 0.5 packs/day for 29.4 years (14.7 ttl pk-yrs)     Types: Cigarettes     Start date: 1995    Smokeless tobacco: Never   Vaping Use    Vaping status: Never Used   Substance and Sexual Activity    Alcohol use: Never    Drug use: Never    Sexual activity: Not Currently     Partners: Male     Comment: LMP: 12/04/21 ON ORAL CONTRACEPTION; G2P0A2     Social Determinants of Health     Financial Resource Strain: Low Risk  (05/08/2022)    Financial Resource Strain     SDOH Financial: No   Transportation Needs: Low Risk  (05/08/2022)    Transportation Needs     SDOH Transportation: No   Social Connections: Low  Risk  (05/08/2022)    Social Connections     SDOH Social Isolation: 5 or more times a week   Intimate Partner Violence: Low Risk  (05/08/2022)    Intimate Partner Violence     SDOH Domestic Violence: No   Housing Stability: Low Risk  (05/08/2022)    Housing Stability     SDOH Housing Situation: I have housing.     SDOH Housing Worry: No     Patient Information  Sleep apnea symptoms: Excessive Daytime Sleepiness and Snoring  Patient's Height: 64  Patient's Weight: 292  Patient's BMI: 52.5  Neck Circumference: 14.5  Epworth Scale Score: 5      Date of Study: 09/02/2022  Study Performed By: Ula Lingo, RRT, RPSGT, CCSH  Study Reviewed By: Ula Lingo, RRT, RPSGT, CCSH    Procedure: Home Sleep study was Performed using: Alice night one. Channels recorded: Airflow acquired by using a nasal pressure cannula, oxygen saturation (SpO2) was monitored using a pulse oximeter, respiratory effort, snoring sound , and heart rate    Sleep Architecture: Total Recording Time: 324.5 min. Total Sleep Time: 324.5 min.    Respiratory Data: Number of Obstructive Apneas Observed: 8. Number of  Central Apneas Observed: 0. Number of Mixed Apneas Observed: 0. Total Number of Apneas: 8. Total number of Hypopneas: 14. Apnea/Hypoapnea Index (AHI) per hour: 4.1. Percent of Time Patient Spent in Supine Position: 96. AHI in Supine Position #: 1.9. Non-supine AHI #: 5.8. Lowest Desaturation Percentage: 79%. Total Amount of Desaturated Time Below or Equal  to 2.8 minutes. Snoring During the Study: Status: Yes.     The study was scored by: 30 second EPOCH, 4 % desaturation and  CMS guidelines.AASM Accredited     Cardiac Data:  The Mean Heart Rate During the Study (Beats/Min): 81.2.   Assessment:   Diagnosis:   (R06.83) Snoring (SCT)    PLAN  Procedure Orders    In lab Sleep Study    Instructions:   Positional Therapy may be helpful in snoring and Nasal steroids may be helpful for primary snoring    Impression:  Ahi 4.1 per hour      Claudell Kyle, RRT      Raw data reviewed.    Mild OSA noted with AHI 4.1 and CPAP is not indicated.  Follow up in office as scheduled.  Lubertha Sayres, DO   09/03/2022  20:21

## 2022-09-11 ENCOUNTER — Other Ambulatory Visit: Payer: Self-pay

## 2022-09-11 ENCOUNTER — Ambulatory Visit: Payer: Managed Care, Other (non HMO) | Attending: PHYSICIAN ASSISTANT | Admitting: PHYSICIAN ASSISTANT

## 2022-09-11 ENCOUNTER — Encounter (HOSPITAL_PSYCHIATRIC): Payer: Self-pay | Admitting: PHYSICIAN ASSISTANT

## 2022-09-11 VITALS — BP 132/99 | HR 74 | Resp 18 | Ht 65.0 in | Wt 292.0 lb

## 2022-09-11 DIAGNOSIS — F410 Panic disorder [episodic paroxysmal anxiety] without agoraphobia: Secondary | ICD-10-CM | POA: Insufficient documentation

## 2022-09-11 DIAGNOSIS — F430 Acute stress reaction: Secondary | ICD-10-CM | POA: Insufficient documentation

## 2022-09-11 DIAGNOSIS — F411 Generalized anxiety disorder: Secondary | ICD-10-CM | POA: Insufficient documentation

## 2022-09-11 DIAGNOSIS — Z634 Disappearance and death of family member: Secondary | ICD-10-CM | POA: Insufficient documentation

## 2022-09-11 DIAGNOSIS — F329 Major depressive disorder, single episode, unspecified: Secondary | ICD-10-CM | POA: Insufficient documentation

## 2022-09-11 MED ORDER — ZOLPIDEM 10 MG TABLET
10.0000 mg | ORAL_TABLET | Freq: Every evening | ORAL | 1 refills | Status: DC
Start: 2022-09-11 — End: 2022-10-10

## 2022-09-11 MED ORDER — LORAZEPAM 0.5 MG TABLET
0.5000 mg | ORAL_TABLET | Freq: Three times a day (TID) | ORAL | 1 refills | Status: DC | PRN
Start: 2022-09-11 — End: 2022-10-10

## 2022-09-11 NOTE — Patient Instructions (Signed)
Take medications as prescribed, avoid drugs and alcohol, call office if symptoms worsen or problems arise.  [phone].

## 2022-09-11 NOTE — Progress Notes (Signed)
BEHAVIORAL MEDICINE, [facility]  [address]  [place] [address]  Operated by [facility]  Progress Note    Name: Lindsey Daugherty MRN:  [id number]   Date: 09/11/2022 DOB:  1976-04-06 (47 y.o.)           Chief Complaint: Major Depression, Bereavement, Stress Reaction, Panic Disorder without Agoraphobia and Generalized Anxiety    Subjective:   Patient reports that she has been moving and going through things at the house. There is still so much to go through. She is buying the house, so she can stay. Some days are worse than others. Today she is really anxious. She did a sleep study, but it caused a lot of anxiety. The results came back and she goes back to her family doctor tomorrow. She is going to re-start the weight loss clinic.   Mood: "anxious".  Medications: Working fairly well with no ill effects. She took the Chantix for three weeks and it made her dizzy.   Appetite:She is eating well with a good appetite.   Sleep: She is getting some sleep with the Ambien, on average 8-9 hours, but the hours are broken. Last night she was up five times to use the restroom.   Energy: Still poor.   Stressors Physical health, grief.    Patient Active Problem List   Diagnosis    Acquired hypothyroidism    Tobacco dependence due to cigarettes    Gastroesophageal reflux disease without esophagitis    Insomnia    Vitamin D deficiency    B12 deficiency    Morbid obesity with BMI of 50.0-59.9, adult (CMS HCC)    Hypertriglyceridemia    Bereavement    Environmental and seasonal allergies    Major depression    Generalized anxiety disorder with panic attacks    Stress reaction    Vaginal odor    Panic disorder without agoraphobia    Obstructive sleep apnea syndrome    Normal gynecologic examination    Nontoxic single thyroid nodule    Noninflammatory disorder of vagina    Non-toxic multinodular goiter    Nicotine dependence    Need for immunization against influenza     Menorrhagia    Family history of malignant neoplasm of digestive organ    Dermatitis, unspecified    Dysmenorrhea    Dizziness and giddiness    Benign neoplasm of thyroid gland    Back pain    Arthralgia of shoulder    Iron deficiency anemia    Abnormal findings on diagnostic imaging of other specified body structures    Weight loss advised    Encounter for weight management     Past Medical History:   Diagnosis Date    Acute vaginitis 08/23/2022    Edema of lower extremity 07/24/2021    Esophageal reflux     Family history of cardiovascular disease 08/23/2022    Insomnia     Lightheadedness 08/23/2022    Migraine     Snoring 08/23/2022    Thyroid cancer (CMS HCC) 2018     Past Surgical History:   Procedure Laterality Date    HX CHOLECYSTECTOMY  02/20/2022    HX PARTIAL THYROIDECTOMY Right 2018    No chemo/radiation; dx thyroid cancer    HX PARTIAL THYROIDECTOMY Left 2020    no chemo/radiation    HX TONSILLECTOMY  1981     Family History   Problem Relation Name Age of Onset  No Known Problems Mother      Diabetes type II Father      Hypertension (High Blood Pressure) Father      Elevated Lipids Father      No Known Problems Sister      No Known Problems Brother      No Known Problems Half-Sister      No Known Problems Half-Brother      No Known Problems Maternal Aunt      No Known Problems Maternal Uncle      No Known Problems Paternal Aunt      No Known Problems Paternal Uncle      No Known Problems Maternal Grandmother      No Known Problems Maternal Grandfather      No Known Problems Paternal Grandmother      No Known Problems Paternal Grandfather      No Known Problems Daughter      No Known Problems Son       Social History     Socioeconomic History    Marital status: Divorced    Number of children: 0    Highest education level: Associate degree: academic program   Occupational History    Occupation: Pharmaceutical   Tobacco Use    Smoking status: Every Day     Current packs/day: 0.50     Average packs/day:  0.5 packs/day for 29.4 years (14.7 ttl pk-yrs)     Types: Cigarettes     Start date: 1995    Smokeless tobacco: Never   Vaping Use    Vaping status: Never Used   Substance and Sexual Activity    Alcohol use: Never    Drug use: Never    Sexual activity: Not Currently     Partners: Male     Comment: LMP: 12/04/21 ON ORAL CONTRACEPTION; G2P0A2     Social Determinants of Health     Financial Resource Strain: Low Risk  (05/08/2022)    Financial Resource Strain     SDOH Financial: No   Transportation Needs: Low Risk  (05/08/2022)    Transportation Needs     SDOH Transportation: No   Social Connections: Low Risk  (05/08/2022)    Social Connections     SDOH Social Isolation: 5 or more times a week   Intimate Partner Violence: Low Risk  (05/08/2022)    Intimate Partner Violence     SDOH Domestic Violence: No   Housing Stability: Low Risk  (05/08/2022)    Housing Stability     SDOH Housing Situation: I have housing.     SDOH Housing Worry: No      Loratadine, Pseudoephedrine, Sulfamethoxazole-trimethoprim, Vitamin e, Vitamin e-safflower oil, and Vitamin e (d-alpha tocopherol)   Current Outpatient Medications   Medication Sig    albuterol sulfate (PROVENTIL OR VENTOLIN OR PROAIR) 90 mcg/actuation Inhalation oral inhaler Take 2 Puffs by inhalation Every 6 hours as needed for up to 30 days    ascorbic acid (SUPER C PO) Take 1 Tablet by mouth Once a day Super c with zinc    citalopram (CELEXA) 40 mg Oral Tablet Take 1 Tablet (40 mg total) by mouth Once a day for 90 days    drospirenone, contraceptive, (SLYND) 4 mg (28) Oral Tablet Take 1 Tablet (4 mg total) by mouth Once a day    fluticasone propionate (FLONASE) 50 mcg/actuation Nasal Spray, Suspension Administer 1 Spray into each nostril Once a day for 90 days    furosemide (LASIX) 20 mg  Oral Tablet Take 1 Tablet (20 mg total) by mouth Once per day as needed    gabapentin (NEURONTIN) 300 mg Oral Capsule Take 1 Capsule (300 mg total) by mouth Once a day    levothyroxine (SYNTHROID)  200 mcg Oral Tablet Take 1 Tablet (200 mcg total) by mouth Every morning for 90 days    LORazepam (ATIVAN) 0.5 mg Oral Tablet TAKE 1 TABLET BY MOUTH 3 TIMES A DAY AS NEEDED FOR ANXIETY.    lovastatin (MEVACOR) 20 mg Oral Tablet Take 1 Tablet (20 mg total) by mouth Every evening with dinner for 90 days    pantoprazole (PROTONIX) 40 mg Oral Tablet, Delayed Release (E.C.) Take 1 Tablet (40 mg total) by mouth Once a day for 90 days    prenatal 21/iron fu/folic acid (PRENATAL COMPLETE ORAL) Take 1 Tablet by mouth Once a day    valACYclovir (VALTREX) 500 mg Oral Tablet Take 1 Tablet (500 mg total) by mouth Once a day    varenicline (CHANTIX) dose pak TAKE AS DIRECTED. (Patient not taking: Reported on 09/11/2022)    zolpidem (AMBIEN) 10 mg Oral Tablet TAKE 1 TABLET BY MOUTH EVERY NIGHT    ZYRTEC 10 mg Oral Tablet Take 1 Tablet (10 mg total) by mouth      Objective :  BP (!) 132/99 (Site: Left, Patient Position: Sitting)   Pulse 74   Resp 18   Ht 1.651 m (5\' 5" )   Wt 132 kg (292 lb)   BMI 48.59 kg/m       PHQ Total Score  PHQ 2 Total: 4  PHQ 9 Total: 17                08/12/2022     8:26 AM 08/23/2022     9:04 AM 09/11/2022     8:33 AM   Most Recent PHQ-9 Scores   PHQ 9 Total 22 11 17         Mental Status Exam  AXOX4. Casual dress, calm, well-groomed. No SI, HI, AVH, delusions, or paranoia. Thoughts are logical, coherent, and goal-directed. Good eye contact. Speech is normal in rate and tone. Mood is "anxious" affect congruent. No psychomotor agitation or retardation, cogwheel rigidity, or abnormal movements. Gait is normal. Attention, concentration, and memory are good. No cognitive deficits noted. Judgment and insight are fair. Calculation and abstraction are within normal limits.     Data Reviewed  I have reviewed patient's previous note medical, surgical, family, and social history in detail today,     Assessment  Major Depression, Bereavement, Stress Reaction, Panic Disorder without Agoraphobia and Generalized  Anxiety    Plan  Continue the following medications:  Celexa 40 mg daily for mood.  Ativan 0.5 mg tid/prn for anxiety.  Ambien 10 mg qhs for sleep.  Discontinue Chantix due to dizziness.       Follow up  Return in four weeks.     Maren Beach, PA-C

## 2022-09-12 ENCOUNTER — Ambulatory Visit (RURAL_HEALTH_CENTER): Payer: Managed Care, Other (non HMO) | Attending: Family | Admitting: Family

## 2022-09-12 ENCOUNTER — Encounter (RURAL_HEALTH_CENTER): Payer: Self-pay | Admitting: Family

## 2022-09-12 VITALS — BP 132/88 | HR 84 | Temp 98.0°F | Resp 18 | Ht 65.0 in | Wt 292.0 lb

## 2022-09-12 DIAGNOSIS — G4730 Sleep apnea, unspecified: Secondary | ICD-10-CM | POA: Insufficient documentation

## 2022-09-12 NOTE — Progress Notes (Addendum)
FAMILY MEDICINE, [facility] FAMILY MEDICINE [facility]  [address]  [place] [address]  Operated by [facility]     Name: Lindsey Daugherty MRN:  [id number]   Date of Birth: 1976/01/23 Age: 47 y.o.   Date: 09/12/2022  Time: 11:56     Provider: Stormy Fabian, NP-C    Reason for visit: Follow Up (Sleep apnea results )      History of Present Illness:  Lindsey Daugherty is a 47 y.o. female presenting to clinic for sleep apnea results.    Patient Active Problem List    Diagnosis Date Noted    Vaginal odor 08/23/2022    Panic disorder without agoraphobia 08/23/2022    Mild sleep apnea 08/23/2022    Normal gynecologic examination 08/23/2022    Nontoxic single thyroid nodule 08/23/2022    Noninflammatory disorder of vagina 08/23/2022    Non-toxic multinodular goiter 08/23/2022    Nicotine dependence 08/23/2022    Need for immunization against influenza 08/23/2022    Menorrhagia 08/23/2022     [name], GYN      Family history of malignant neoplasm of digestive organ 08/23/2022    Dermatitis, unspecified 08/23/2022    Dysmenorrhea 08/23/2022    Dizziness and giddiness 08/23/2022    Benign neoplasm of thyroid gland 08/23/2022    Back pain 08/23/2022    Arthralgia of shoulder 08/23/2022    Iron deficiency anemia 08/23/2022    Abnormal findings on diagnostic imaging of other specified body structures 08/23/2022    Weight loss advised 08/23/2022    Encounter for weight management 08/23/2022    Stress reaction 06/12/2022    Major depression 06/05/2022    Generalized anxiety disorder with panic attacks 06/05/2022     Managed by [employer]      Bereavement 05/08/2022    Environmental and seasonal allergies 05/08/2022    Hypertriglyceridemia 03/25/2022    Acquired hypothyroidism 09/19/2021    Tobacco dependence due to cigarettes 09/19/2021    Gastroesophageal reflux disease without esophagitis 09/19/2021    Vitamin D deficiency 09/19/2021    B12 deficiency 09/19/2021    Morbid  obesity with BMI of 50.0-59.9, adult (CMS HCC) 09/19/2021    Insomnia 06/11/2021     Managed by [facility]         Historical Data    Past Medical History:  Past Medical History:   Diagnosis Date    Acute vaginitis 08/23/2022    Edema of lower extremity 07/24/2021    Esophageal reflux     Family history of cardiovascular disease 08/23/2022    Insomnia     Lightheadedness 08/23/2022    Migraine     Snoring 08/23/2022    Thyroid cancer (CMS HCC) 2018     Past Surgical History:  Past Surgical History:   Procedure Laterality Date    HX CHOLECYSTECTOMY  02/20/2022    HX PARTIAL THYROIDECTOMY Right 2018    No chemo/radiation; dx thyroid cancer    HX PARTIAL THYROIDECTOMY Left 2020    no chemo/radiation    HX TONSILLECTOMY  1981     Allergies:  Allergies   Allergen Reactions    Loratadine Shortness of Breath and  Other Adverse Reaction (Add comment)     claritan D    Pseudoephedrine Shortness of Breath    Sulfamethoxazole-Trimethoprim Itching and Hives/ Urticaria    Vitamin E Rash    Vitamin E-Safflower Oil     Vitamin E (D-Alpha Tocopherol) Itching  Medications:  Current Outpatient Medications   Medication Sig    albuterol sulfate (PROVENTIL OR VENTOLIN OR PROAIR) 90 mcg/actuation Inhalation oral inhaler Take 2 Puffs by inhalation Every 6 hours as needed for up to 30 days    ascorbic acid (SUPER C PO) Take 1 Tablet by mouth Once a day Super c with zinc    citalopram (CELEXA) 40 mg Oral Tablet Take 1 Tablet (40 mg total) by mouth Once a day for 90 days    drospirenone, contraceptive, (SLYND) 4 mg (28) Oral Tablet Take 1 Tablet (4 mg total) by mouth Once a day    fluconazole (DIFLUCAN) 150 mg Oral Tablet     fluticasone propionate (FLONASE) 50 mcg/actuation Nasal Spray, Suspension Administer 1 Spray into each nostril Once a day for 90 days    furosemide (LASIX) 20 mg Oral Tablet Take 1 Tablet (20 mg total) by mouth Once per day as needed    gabapentin (NEURONTIN) 300 mg Oral Capsule Take 1 Capsule (300 mg  total) by mouth Once a day    levothyroxine (SYNTHROID) 200 mcg Oral Tablet Take 1 Tablet (200 mcg total) by mouth Every morning for 90 days    LORazepam (ATIVAN) 0.5 mg Oral Tablet Take 1 Tablet (0.5 mg total) by mouth Three times a day as needed for Anxiety    lovastatin (MEVACOR) 20 mg Oral Tablet Take 1 Tablet (20 mg total) by mouth Every evening with dinner for 90 days    pantoprazole (PROTONIX) 40 mg Oral Tablet, Delayed Release (E.C.) Take 1 Tablet (40 mg total) by mouth Once a day for 90 days    prenatal 21/iron fu/folic acid (PRENATAL COMPLETE ORAL) Take 1 Tablet by mouth Once a day    valACYclovir (VALTREX) 500 mg Oral Tablet Take 1 Tablet (500 mg total) by mouth Once a day    zolpidem (AMBIEN) 10 mg Oral Tablet Take 1 Tablet (10 mg total) by mouth Every night    ZYRTEC 10 mg Oral Tablet Take 1 Tablet (10 mg total) by mouth     Family History:  Family Medical History:       Problem Relation (Age of Onset)    Diabetes type II Father    Elevated Lipids Father    Hypertension (High Blood Pressure) Father    No Known Problems Mother, Sister, Brother, Half-Sister, Half-Brother, Maternal Aunt, Maternal Uncle, Paternal Aunt, Paternal Uncle, Maternal Grandmother, Maternal Grandfather, Paternal Grandmother, Paternal Grandfather, Daughter, Son            Social History:  Social History     Socioeconomic History    Marital status: Divorced    Number of children: 0    Highest education level: Associate degree: academic program   Occupational History    Occupation: Pharmaceutical   Tobacco Use    Smoking status: Every Day     Current packs/day: 0.50     Average packs/day: 0.5 packs/day for 29.4 years (14.7 ttl pk-yrs)     Types: Cigarettes     Start date: 1995    Smokeless tobacco: Never   Vaping Use    Vaping status: Never Used   Substance and Sexual Activity    Alcohol use: Never    Drug use: Never    Sexual activity: Not Currently     Partners: Male     Comment: LMP: 12/04/21 ON ORAL CONTRACEPTION; G2P0A2     Social  Determinants of Health     Financial Resource Strain: Low Risk  (09/12/2022)  Financial Resource Strain     SDOH Financial: No   Transportation Needs: Low Risk  (09/12/2022)    Transportation Needs     SDOH Transportation: No   Social Connections: Low Risk  (09/12/2022)    Social Connections     SDOH Social Isolation: 5 or more times a week   Intimate Partner Violence: Low Risk  (09/12/2022)    Intimate Partner Violence     SDOH Domestic Violence: No   Housing Stability: Low Risk  (09/12/2022)    Housing Stability     SDOH Housing Situation: I have housing.     SDOH Housing Worry: No           Review of Systems:  Any pertinent Review of Systems as addressed in the HPI above.    Physical Exam:  Vital Signs:  Vitals:    09/12/22 1004   BP: 132/88   Pulse: 84   Resp: 18   Temp: 36.7 C (98 F)   SpO2: 98%   Weight: 132 kg (292 lb)   Height: 1.651 m (5\' 5" )   BMI: 48.69     Physical Exam  Vitals reviewed.   Constitutional:       Appearance: She is obese.   Neck:      Thyroid: No thyroid mass, thyromegaly or thyroid tenderness.   Cardiovascular:      Rate and Rhythm: Normal rate and regular rhythm.      Pulses: Normal pulses.      Heart sounds: Normal heart sounds, S1 normal and S2 normal.   Pulmonary:      Effort: Pulmonary effort is normal.      Breath sounds: Normal breath sounds.   Musculoskeletal:      Cervical back: Neck supple.      Right lower leg: No edema.      Left lower leg: No edema.   Skin:     General: Skin is warm.   Neurological:      General: No focal deficit present.      Mental Status: She is alert and oriented to person, place, and time. Mental status is at baseline.      Cranial Nerves: Cranial nerves 2-12 are intact.      Motor: Motor function is intact.      Coordination: Coordination is intact.      Gait: Gait is intact.   Psychiatric:         Mood and Affect: Mood normal.         Behavior: Behavior normal. Behavior is cooperative.         Thought Content: Thought content normal.         Cognition  and Memory: Cognition normal.         Judgment: Judgment normal.          Assessment/Plan:  Problem List Items Addressed This Visit          Respiratory    Mild sleep apnea - Primary     No CPAP recommended. Advise to continue daily nasal spray, strongly advsied smoking cessation and encourage 10% weight loss goal.  Advised regular schedule aerobic exercise and strength training.  Advised to sleep on stomach or side. Avoid alcohol and sedatives.  Advised to set aside 8 hours each to sleep and discussed sleep training.            Discussed sleep study dated dated 09/02/2022 with patient.  No orders of the defined types were placed in this  encounter.     Tobacco cessation counseling performed for 6 minutes  patient declined smoking cessation referral at this time.     Labs drawn today.  Continue current medications.  Advised a low-fat/low sodium diet, advised 150 minutes of scheduled weekly physical activity as tolerated.  Advised to maintain a healthy weight.     Return if symptoms worsen or fail to improve, for routine appt scheduled for 11/29/2022.    Stormy Fabian, NP-C     Portions of this note may be dictated using voice recognition software or a dictation service. Variances in spelling and vocabulary are possible and unintentional. Not all errors are caught/corrected. Please notify the Thereasa Parkin if any discrepancies are noted or if the meaning of any statement is not clear.

## 2022-09-12 NOTE — Assessment & Plan Note (Signed)
No CPAP recommended. Advise to continue daily nasal spray, strongly advsied smoking cessation and encourage 10% weight loss goal.  Advised regular schedule aerobic exercise and strength training.  Advised to sleep on stomach or side. Avoid alcohol and sedatives.  Advised to set aside 8 hours each to sleep and discussed sleep training.

## 2022-09-12 NOTE — Progress Notes (Deleted)
[facility] MEDICINE [facility]  [facility] FAMILY MEDICINE      Lindsey Daugherty  MRN: [id number]  DOB: 29-Nov-1975  Date of Service: 09/12/2022    CHIEF COMPLAINT  Chief Complaint   Patient presents with    Follow Up     Sleep apnea results        SUBJECTIVE  Lindsey Daugherty is a 47 y.o. female who presents to clinic for ***.     Review of Systems:  Positive ROS discussed in HPI, otherwise all other systems negative.      Medications:   albuterol sulfate (PROVENTIL OR VENTOLIN OR PROAIR) 90 mcg/actuation Inhalation oral inhaler, Take 2 Puffs by inhalation Every 6 hours as needed for up to 30 days  ascorbic acid (SUPER C PO), Take 1 Tablet by mouth Once a day Super c with zinc  citalopram (CELEXA) 40 mg Oral Tablet, Take 1 Tablet (40 mg total) by mouth Once a day for 90 days  drospirenone, contraceptive, (SLYND) 4 mg (28) Oral Tablet, Take 1 Tablet (4 mg total) by mouth Once a day  fluconazole (DIFLUCAN) 150 mg Oral Tablet,   fluticasone propionate (FLONASE) 50 mcg/actuation Nasal Spray, Suspension, Administer 1 Spray into each nostril Once a day for 90 days  furosemide (LASIX) 20 mg Oral Tablet, Take 1 Tablet (20 mg total) by mouth Once per day as needed  gabapentin (NEURONTIN) 300 mg Oral Capsule, Take 1 Capsule (300 mg total) by mouth Once a day  levothyroxine (SYNTHROID) 200 mcg Oral Tablet, Take 1 Tablet (200 mcg total) by mouth Every morning for 90 days  LORazepam (ATIVAN) 0.5 mg Oral Tablet, Take 1 Tablet (0.5 mg total) by mouth Three times a day as needed for Anxiety  lovastatin (MEVACOR) 20 mg Oral Tablet, Take 1 Tablet (20 mg total) by mouth Every evening with dinner for 90 days  pantoprazole (PROTONIX) 40 mg Oral Tablet, Delayed Release (E.C.), Take 1 Tablet (40 mg total) by mouth Once a day for 90 days  prenatal 21/iron fu/folic acid (PRENATAL COMPLETE ORAL), Take 1 Tablet by mouth Once a day  valACYclovir (VALTREX) 500 mg Oral Tablet, Take 1 Tablet (500 mg total) by mouth Once a day  zolpidem  (AMBIEN) 10 mg Oral Tablet, Take 1 Tablet (10 mg total) by mouth Every night  ZYRTEC 10 mg Oral Tablet, Take 1 Tablet (10 mg total) by mouth    No facility-administered medications prior to visit.      Allergies:   Allergies   Allergen Reactions    Loratadine Shortness of Breath and  Other Adverse Reaction (Add comment)     claritan D    Pseudoephedrine Shortness of Breath    Sulfamethoxazole-Trimethoprim Itching and Hives/ Urticaria    Vitamin E Rash    Vitamin E-Safflower Oil     Vitamin E (D-Alpha Tocopherol) Itching         OBJECTIVE  BP 132/88   Pulse 84   Temp 36.7 C (98 F)   Resp 18   Ht 1.651 m (5\' 5" )   Wt 132 kg (292 lb)   SpO2 98%   BMI 48.59 kg/m       Physical Exam      ASSESSMENT/PLAN  No diagnosis found.     Problem List Items Addressed This Visit    None     No orders of the defined types were placed in this encounter.       No follow-ups on file.    Elnita Surprenant L  Aggie Hacker, NP-C 09/12/2022, 10:08

## 2022-10-01 ENCOUNTER — Encounter (RURAL_HEALTH_CENTER): Payer: Self-pay | Admitting: Family

## 2022-10-10 ENCOUNTER — Ambulatory Visit: Payer: Managed Care, Other (non HMO) | Attending: PHYSICIAN ASSISTANT | Admitting: PHYSICIAN ASSISTANT

## 2022-10-10 ENCOUNTER — Other Ambulatory Visit: Payer: Self-pay

## 2022-10-10 ENCOUNTER — Encounter (HOSPITAL_PSYCHIATRIC): Payer: Self-pay | Admitting: PHYSICIAN ASSISTANT

## 2022-10-10 VITALS — BP 129/74 | HR 85 | Resp 17 | Ht 65.0 in | Wt 290.0 lb

## 2022-10-10 DIAGNOSIS — F410 Panic disorder [episodic paroxysmal anxiety] without agoraphobia: Secondary | ICD-10-CM | POA: Insufficient documentation

## 2022-10-10 DIAGNOSIS — F329 Major depressive disorder, single episode, unspecified: Secondary | ICD-10-CM | POA: Insufficient documentation

## 2022-10-10 DIAGNOSIS — F411 Generalized anxiety disorder: Secondary | ICD-10-CM | POA: Insufficient documentation

## 2022-10-10 DIAGNOSIS — Z634 Disappearance and death of family member: Secondary | ICD-10-CM | POA: Insufficient documentation

## 2022-10-10 MED ORDER — ZOLPIDEM 10 MG TABLET
10.0000 mg | ORAL_TABLET | Freq: Every evening | ORAL | 3 refills | Status: DC
Start: 2022-10-10 — End: 2022-12-10

## 2022-10-10 MED ORDER — LORAZEPAM 0.5 MG TABLET
0.5000 mg | ORAL_TABLET | Freq: Three times a day (TID) | ORAL | 3 refills | Status: DC | PRN
Start: 2022-10-10 — End: 2022-12-10

## 2022-10-10 MED ORDER — CITALOPRAM 40 MG TABLET
40.0000 mg | ORAL_TABLET | Freq: Every day | ORAL | 1 refills | Status: DC
Start: 2022-10-10 — End: 2022-12-10

## 2022-10-10 NOTE — Patient Instructions (Signed)
Take medications as prescribed, avoid drugs and alcohol, call office if symptoms worsen or problems arise.  [phone].

## 2022-10-10 NOTE — Progress Notes (Signed)
BEHAVIORAL MEDICINE, [facility]  [address]  [place] [address]  Operated by [facility]  Progress Note    Name: Lindsey Daugherty MRN:  [id number]   Date: 10/10/2022 DOB:  [birth year] (47 y.o.)           Chief Complaint: Generalized Anxiety, Panic Disorder without Agoraphobia and Major Depression    Subjective:   Patient reports that today is a little hard, because it would have been her parents 60th wedding anniversary. Father's Day was difficult for her as well. She spent the day cleaning and trying to keep busy. On the Saturday, she [place] and spoke with her parents. They are still going through the house. Her brothers have not been able to come back for awhile. Her nephew and he plays travel baseball, so they have a lot of demands on their time. She signed the contract yesterday for the house and she meets with the bank and gets the process started. She is hoping by the end of July, it will be her house. Her panic attacks are coming less frequently. She still has an anxious feeling.She is still depressed and doesn't want to do anything or go anywhere. Her next-store neighbor tries to get her out more frequently. She is going back to her weight loss doctor in [place]. They want to put her on the Semaglutide injection, but she has to see an Endocrinologist first. She smokes about 1/2 ppd, but cannot quit at this time.   Mood: "pretty good".  Medications: They are helping, and she only takes it when she absolutely needs the Ativan.  Appetite: So-so. She is trying to watch what she is eating, but she has put off having gastric bypass surgery for the time being.   Sleep:Doing better   Energy: Fluctuates with her mood.  Stressors: House going into her name and finalizing the process.     Patient Active Problem List   Diagnosis    Acquired hypothyroidism    Tobacco dependence due to cigarettes    Gastroesophageal reflux disease without  esophagitis    Insomnia    Vitamin D deficiency    B12 deficiency    Morbid obesity with BMI of 50.0-59.9, adult (CMS HCC)    Hypertriglyceridemia    Bereavement    Environmental and seasonal allergies    Major depression    Generalized anxiety disorder with panic attacks    Stress reaction    Vaginal odor    Panic disorder without agoraphobia    Mild sleep apnea    Normal gynecologic examination    Nontoxic single thyroid nodule    Noninflammatory disorder of vagina    Non-toxic multinodular goiter    Nicotine dependence    Need for immunization against influenza    Menorrhagia    Family history of malignant neoplasm of digestive organ    Dermatitis, unspecified    Dysmenorrhea    Dizziness and giddiness    Benign neoplasm of thyroid gland    Back pain    Arthralgia of shoulder    Iron deficiency anemia    Abnormal findings on diagnostic imaging of other specified body structures    Weight loss advised    Encounter for weight management     Past Medical History:   Diagnosis Date    Acute vaginitis 08/23/2022    Edema of lower extremity 07/24/2021    Esophageal reflux     Family history of cardiovascular  disease 08/23/2022    Insomnia     Lightheadedness 08/23/2022    Migraine     Snoring 08/23/2022    Thyroid cancer (CMS HCC) 2018     Past Surgical History:   Procedure Laterality Date    HX CHOLECYSTECTOMY  02/20/2022    HX PARTIAL THYROIDECTOMY Right 2018    No chemo/radiation; dx thyroid cancer    HX PARTIAL THYROIDECTOMY Left 2020    no chemo/radiation    HX TONSILLECTOMY  1981     Family History   Problem Relation Name Age of Onset    No Known Problems Mother      Diabetes type II Father      Hypertension (High Blood Pressure) Father      Elevated Lipids Father      No Known Problems Sister      No Known Problems Brother      No Known Problems Half-Sister      No Known Problems Half-Brother      No Known Problems Maternal Aunt      No Known Problems Maternal Uncle      No Known Problems Paternal Aunt      No  Known Problems Paternal Uncle      No Known Problems Maternal Grandmother      No Known Problems Maternal Grandfather      No Known Problems Paternal Grandmother      No Known Problems Paternal Grandfather      No Known Problems Daughter      No Known Problems Son       Social History     Socioeconomic History    Marital status: Divorced    Number of children: 0    Highest education level: Associate degree: academic program   Occupational History    Occupation: Pharmaceutical   Tobacco Use    Smoking status: Every Day     Current packs/day: 0.50     Average packs/day: 0.5 packs/day for 29.5 years (14.7 ttl pk-yrs)     Types: Cigarettes     Start date: 1995    Smokeless tobacco: Never   Vaping Use    Vaping status: Never Used   Substance and Sexual Activity    Alcohol use: Never    Drug use: Never    Sexual activity: Not Currently     Partners: Male     Comment: LMP: 12/04/21 ON ORAL CONTRACEPTION; G2P0A2     Social Determinants of Health     Financial Resource Strain: Low Risk  (09/12/2022)    Financial Resource Strain     SDOH Financial: No   Transportation Needs: Low Risk  (09/12/2022)    Transportation Needs     SDOH Transportation: No   Social Connections: Low Risk  (09/12/2022)    Social Connections     SDOH Social Isolation: 5 or more times a week   Intimate Partner Violence: Low Risk  (09/12/2022)    Intimate Partner Violence     SDOH Domestic Violence: No   Housing Stability: Low Risk  (09/12/2022)    Housing Stability     SDOH Housing Situation: I have housing.     SDOH Housing Worry: No      Loratadine, Pseudoephedrine, Sulfamethoxazole-trimethoprim, Vitamin e, Vitamin e-safflower oil, and Vitamin e (d-alpha tocopherol)   Current Outpatient Medications   Medication Sig    ascorbic acid (SUPER C PO) Take 1 Tablet by mouth Once a day Super c with zinc  citalopram (CELEXA) 40 mg Oral Tablet Take 1 Tablet (40 mg total) by mouth Once a day for 90 days    drospirenone, contraceptive, (SLYND) 4 mg (28) Oral Tablet  Take 1 Tablet (4 mg total) by mouth Once a day    fluconazole (DIFLUCAN) 150 mg Oral Tablet     fluticasone propionate (FLONASE) 50 mcg/actuation Nasal Spray, Suspension Administer 1 Spray into each nostril Once a day for 90 days    furosemide (LASIX) 20 mg Oral Tablet Take 1 Tablet (20 mg total) by mouth Once per day as needed    gabapentin (NEURONTIN) 300 mg Oral Capsule Take 1 Capsule (300 mg total) by mouth Once a day    levothyroxine (SYNTHROID) 200 mcg Oral Tablet Take 1 Tablet (200 mcg total) by mouth Every morning for 90 days    LORazepam (ATIVAN) 0.5 mg Oral Tablet Take 1 Tablet (0.5 mg total) by mouth Three times a day as needed for Anxiety    lovastatin (MEVACOR) 20 mg Oral Tablet Take 1 Tablet (20 mg total) by mouth Every evening with dinner for 90 days    pantoprazole (PROTONIX) 40 mg Oral Tablet, Delayed Release (E.C.) Take 1 Tablet (40 mg total) by mouth Once a day for 90 days    prenatal 21/iron fu/folic acid (PRENATAL COMPLETE ORAL) Take 1 Tablet by mouth Once a day    valACYclovir (VALTREX) 500 mg Oral Tablet Take 1 Tablet (500 mg total) by mouth Once a day    zolpidem (AMBIEN) 10 mg Oral Tablet Take 1 Tablet (10 mg total) by mouth Every night    ZYRTEC 10 mg Oral Tablet Take 1 Tablet (10 mg total) by mouth      Objective :  BP 129/74 (Site: Left, Patient Position: Sitting)   Pulse 85   Resp 17   Ht 1.651 m (5\' 5" )   Wt 132 kg (290 lb)   BMI 48.26 kg/m       PHQ Total Score  PHQ 2 Total: 5  PHQ 9 Total: 14  Interpretation of Total Score: 10-14 Moderate depression             08/23/2022     9:04 AM 09/11/2022     8:33 AM 10/10/2022     9:07 AM   Most Recent PHQ-9 Scores   PHQ 9 Total 11 17 14         Mental Status Exam  AXOX4. Casual dress, calm, well-groomed. No SI, HI, AVH, delusions, or paranoia. Thoughts are logical, coherent, and goal-directed. Good eye contact. Speech is normal in rate and tone. Mood is "pretty good" affect congruent. No psychomotor agitation or retardation, cogwheel  rigidity, or abnormal movements. Gait is normal. Attention, concentration, and memory are good. No cognitive deficits noted. Judgment and insight are fair. Calculation and abstraction are within normal limits.     Data Reviewed  I have reviewed patient's previous note medical, surgical, family, and social history in detail today,     Assessment  Generalized Anxiety, Panic Disorder without Agoraphobia and Major Depression    Plan  Continue the following medications:  Celexa 40 mg daily for mood.  Ativan 0.5 mg tid/prn for anxiety.  Ambien 10 mg qhs for sleep.      Follow up  Return to clinic in 8 weeks.    Maren Beach, PA-C

## 2022-10-19 ENCOUNTER — Other Ambulatory Visit: Payer: Self-pay

## 2022-11-25 ENCOUNTER — Other Ambulatory Visit: Payer: Self-pay

## 2022-11-25 ENCOUNTER — Encounter (RURAL_HEALTH_CENTER): Payer: Self-pay | Admitting: Family

## 2022-11-25 ENCOUNTER — Ambulatory Visit (RURAL_HEALTH_CENTER): Payer: Managed Care, Other (non HMO) | Attending: Family | Admitting: Family

## 2022-11-25 ENCOUNTER — Telehealth (RURAL_HEALTH_CENTER): Payer: Self-pay | Admitting: Family

## 2022-11-25 VITALS — BP 131/88 | HR 80 | Temp 98.6°F | Resp 16 | Ht 65.0 in | Wt 296.0 lb

## 2022-11-25 DIAGNOSIS — Z6841 Body Mass Index (BMI) 40.0 and over, adult: Secondary | ICD-10-CM | POA: Insufficient documentation

## 2022-11-25 DIAGNOSIS — E781 Pure hyperglyceridemia: Secondary | ICD-10-CM | POA: Insufficient documentation

## 2022-11-25 DIAGNOSIS — K219 Gastro-esophageal reflux disease without esophagitis: Secondary | ICD-10-CM | POA: Insufficient documentation

## 2022-11-25 DIAGNOSIS — E559 Vitamin D deficiency, unspecified: Secondary | ICD-10-CM

## 2022-11-25 DIAGNOSIS — F17210 Nicotine dependence, cigarettes, uncomplicated: Secondary | ICD-10-CM | POA: Insufficient documentation

## 2022-11-25 DIAGNOSIS — E039 Hypothyroidism, unspecified: Secondary | ICD-10-CM

## 2022-11-25 DIAGNOSIS — E538 Deficiency of other specified B group vitamins: Secondary | ICD-10-CM | POA: Insufficient documentation

## 2022-11-25 DIAGNOSIS — G4730 Sleep apnea, unspecified: Secondary | ICD-10-CM | POA: Insufficient documentation

## 2022-11-25 DIAGNOSIS — F17200 Nicotine dependence, unspecified, uncomplicated: Secondary | ICD-10-CM

## 2022-11-25 DIAGNOSIS — F5104 Psychophysiologic insomnia: Secondary | ICD-10-CM | POA: Insufficient documentation

## 2022-11-25 DIAGNOSIS — Z789 Other specified health status: Secondary | ICD-10-CM | POA: Insufficient documentation

## 2022-11-25 NOTE — Assessment & Plan Note (Signed)
Further treatment pending labs.

## 2022-11-25 NOTE — Assessment & Plan Note (Signed)
Failed therapy on Changix, prescribed by Dr Massie Maroon, [facility].  Patient reports she is not interested in smoking cessation at time.

## 2022-11-25 NOTE — Assessment & Plan Note (Signed)
Symptoms well controlled with Protonix 40 mg daily.  Continue current medications.

## 2022-11-25 NOTE — Assessment & Plan Note (Signed)
Continue vitamin B12 1000 mcg daily.

## 2022-11-25 NOTE — Assessment & Plan Note (Signed)
Continue lovastatin 20 mg daily to lower triglycerides levels.  Advise to limit high fat foods, processed foods and fast foods in diet.  Discussed side effects.  Rechecked labs in 3 months.

## 2022-11-25 NOTE — Assessment & Plan Note (Signed)
Continue levothyroxine 200 mcg daily.  Patient denies missed doses.  Advised to take levothyroxine in the morning on an empty stomach and wait 30 minutes prior to eating/drinking.

## 2022-11-25 NOTE — Assessment & Plan Note (Signed)
Continue Ambien 5 mg q.h.s..  Discussed sleep hygiene.  Advise the importance of being on a sleep routine.

## 2022-11-25 NOTE — Progress Notes (Signed)
FAMILY MEDICINE, [facility] FAMILY MEDICINE [facility]  [address]  [place] [address]  Operated by [facility]     Name: Lindsey Daugherty MRN:  [id number]   Date of Birth: 1975/11/23 Age: 47 y.o.   Date: 11/25/2022  Time: 17:01     Provider: Stormy Fabian, NP-C    Reason for visit: Follow Up 3 Months      History of Present Illness:  Lindsey Daugherty is a 47 y.o. female presenting with chronic disease management.      Patient reports overall she is doing very well.  Patient reports she recently purchased a home.  Denies chest pain, palpitations or shortness breath.  Denies fever, cough, nausea, vomiting or diarrhea.  Denies recent hospitalizations or surgeries.      Patient Active Problem List    Diagnosis Date Noted    Panic disorder without agoraphobia 08/23/2022    Mild sleep apnea 08/23/2022    Normal gynecologic examination 08/23/2022    Nontoxic single thyroid nodule 08/23/2022    Noninflammatory disorder of vagina 08/23/2022    Non-toxic multinodular goiter 08/23/2022    Nicotine dependence 08/23/2022    Need for immunization against influenza 08/23/2022    Menorrhagia 08/23/2022     [name], GYN      Family history of malignant neoplasm of digestive organ 08/23/2022    Dizziness and giddiness 08/23/2022    Benign neoplasm of thyroid gland 08/23/2022    Back pain 08/23/2022    Arthralgia of shoulder 08/23/2022    Iron deficiency anemia 08/23/2022    Abnormal findings on diagnostic imaging of other specified body structures 08/23/2022    Weight loss advised 08/23/2022    Encounter for weight management 08/23/2022    Major depression 06/05/2022    Generalized anxiety disorder with panic attacks 06/05/2022     Managed by [facility]      Environmental and seasonal allergies 05/08/2022    Hypertriglyceridemia 03/25/2022    Acquired hypothyroidism 09/19/2021    Tobacco dependence due to cigarettes 09/19/2021    Gastroesophageal reflux disease without esophagitis  09/19/2021    Vitamin D deficiency 09/19/2021    B12 deficiency 09/19/2021    Morbid obesity with BMI of 50.0-59.9, adult (CMS HCC) 09/19/2021    Insomnia 06/11/2021     Managed by [facility]         Historical Data    Past Medical History:  Past Medical History:   Diagnosis Date    Acute vaginitis 08/23/2022    Bereavement 05/08/2022    Dermatitis, unspecified 08/23/2022    Dysmenorrhea 08/23/2022    Edema of lower extremity 07/24/2021    Esophageal reflux     Family history of cardiovascular disease 08/23/2022    Insomnia     Lightheadedness 08/23/2022    Migraine     Snoring 08/23/2022    Thyroid cancer (CMS HCC) 2018    Vaginal odor 08/23/2022     Past Surgical History:  Past Surgical History:   Procedure Laterality Date    HX CHOLECYSTECTOMY  02/20/2022    HX PARTIAL THYROIDECTOMY Right 2018    No chemo/radiation; dx thyroid cancer    HX PARTIAL THYROIDECTOMY Left 2020    no chemo/radiation    HX TONSILLECTOMY  1981     Allergies:  Allergies   Allergen Reactions    Loratadine Shortness of Breath and  Other Adverse Reaction (Add comment)     claritan D  Pseudoephedrine Shortness of Breath    Sulfamethoxazole-Trimethoprim Itching and Hives/ Urticaria    Vitamin E Rash    Vitamin E-Safflower Oil     Vitamin E (D-Alpha Tocopherol) Itching     Medications:  Current Outpatient Medications   Medication Sig    ascorbic acid (SUPER C PO) Take 1 Tablet by mouth Once a day Super c with zinc    citalopram (CELEXA) 40 mg Oral Tablet Take 1 Tablet (40 mg total) by mouth Once a day for 180 days    drospirenone, contraceptive, (SLYND) 4 mg (28) Oral Tablet Take 1 Tablet (4 mg total) by mouth Once a day    fluconazole (DIFLUCAN) 150 mg Oral Tablet     furosemide (LASIX) 20 mg Oral Tablet Take 1 Tablet (20 mg total) by mouth Once per day as needed    gabapentin (NEURONTIN) 300 mg Oral Capsule Take 1 Capsule (300 mg total) by mouth Once a day    levothyroxine (SYNTHROID) 200 mcg Oral Tablet Take 1 Tablet (200  mcg total) by mouth Every morning for 90 days    LORazepam (ATIVAN) 0.5 mg Oral Tablet Take 1 Tablet (0.5 mg total) by mouth Three times a day as needed for Anxiety    lovastatin (MEVACOR) 20 mg Oral Tablet Take 1 Tablet (20 mg total) by mouth Every evening with dinner for 90 days    pantoprazole (PROTONIX) 40 mg Oral Tablet, Delayed Release (E.C.) Take 1 Tablet (40 mg total) by mouth Once a day for 90 days    prenatal 21/iron fu/folic acid (PRENATAL COMPLETE ORAL) Take 1 Tablet by mouth Once a day    valACYclovir (VALTREX) 500 mg Oral Tablet Take 1 Tablet (500 mg total) by mouth Once a day    zolpidem (AMBIEN) 10 mg Oral Tablet Take 1 Tablet (10 mg total) by mouth Every night    ZYRTEC 10 mg Oral Tablet Take 1 Tablet (10 mg total) by mouth     Family History:  Family Medical History:       Problem Relation (Age of Onset)    Diabetes type II Father    Elevated Lipids Father    Hypertension (High Blood Pressure) Father    No Known Problems Mother, Sister, Brother, Half-Sister, Half-Brother, Maternal Aunt, Maternal Uncle, Paternal Aunt, Paternal Uncle, Maternal Grandmother, Maternal Grandfather, Paternal Grandmother, Paternal Grandfather, Daughter, Son            Social History:  Social History     Socioeconomic History    Marital status: Divorced    Number of children: 0    Highest education level: Associate degree: academic program   Occupational History    Occupation: Pharmaceutical   Tobacco Use    Smoking status: Every Day     Current packs/day: 0.50     Average packs/day: 0.5 packs/day for 29.6 years (14.8 ttl pk-yrs)     Types: Cigarettes     Start date: 1995    Smokeless tobacco: Never   Vaping Use    Vaping status: Never Used   Substance and Sexual Activity    Alcohol use: Never    Drug use: Never    Sexual activity: Not Currently     Partners: Male     Comment: LMP: 12/04/21 ON ORAL CONTRACEPTION; G2P0A2     Social Determinants of Health     Financial Resource Strain: Low Risk  (11/25/2022)    Financial  Resource Strain     SDOH Financial: No   Transportation Needs:  Low Risk  (11/25/2022)    Transportation Needs     SDOH Transportation: No   Social Connections: Low Risk  (11/25/2022)    Social Connections     SDOH Social Isolation: 5 or more times a week   Intimate Partner Violence: Low Risk  (11/25/2022)    Intimate Partner Violence     SDOH Domestic Violence: No   Housing Stability: Low Risk  (11/25/2022)    Housing Stability     SDOH Housing Situation: I have housing.     SDOH Housing Worry: No           Review of Systems:  Any pertinent Review of Systems as addressed in the HPI above.    Physical Exam:  Vital Signs:  Vitals:    11/25/22 0909   BP: 131/88   Pulse: 80   Resp: 16   Temp: 37 C (98.6 F)   SpO2: 97%   Weight: 134 kg (296 lb)   Height: 1.651 m (5\' 5" )   BMI: 49.36     Physical Exam  Vitals reviewed.   Constitutional:       Appearance: Normal appearance. She is obese.   HENT:      Head: Normocephalic.      Mouth/Throat:      Pharynx: Oropharynx is clear.   Eyes:      Extraocular Movements: Extraocular movements intact.      Conjunctiva/sclera: Conjunctivae normal.      Pupils: Pupils are equal, round, and reactive to light.   Neck:      Thyroid: No thyroid mass, thyromegaly or thyroid tenderness.   Cardiovascular:      Rate and Rhythm: Normal rate and regular rhythm.      Pulses: Normal pulses.      Heart sounds: Normal heart sounds, S1 normal and S2 normal.   Pulmonary:      Effort: Pulmonary effort is normal.      Breath sounds: Normal breath sounds.   Abdominal:      General: Bowel sounds are normal.      Palpations: Abdomen is soft.   Musculoskeletal:         General: Normal range of motion.      Cervical back: Normal range of motion and neck supple.      Right lower leg: No edema.      Left lower leg: No edema.   Skin:     General: Skin is warm and dry.   Neurological:      General: No focal deficit present.      Mental Status: She is alert and oriented to person, place, and time. Mental status is at  baseline.      Cranial Nerves: Cranial nerves 2-12 are intact.      Motor: Motor function is intact.      Coordination: Coordination is intact.      Gait: Gait is intact.   Psychiatric:         Mood and Affect: Mood normal.         Behavior: Behavior normal. Behavior is cooperative.         Thought Content: Thought content normal.         Cognition and Memory: Cognition normal.         Judgment: Judgment normal.          Assessment/Plan:  (E78.1) Hypertriglyceridemia  (primary encounter diagnosis)  Plan: CBC, COMPREHENSIVE METABOLIC PNL, FASTING,         LIPID  PANEL, MAGNESIUM, THYROID STIMULATING         HORMONE (SENSITIVE TSH), VITAMIN D 25 TOTAL,         VITAMIN B12    (G47.30) Mild sleep apnea  Plan: CBC, COMPREHENSIVE METABOLIC PNL, FASTING,         LIPID PANEL, MAGNESIUM, THYROID STIMULATING         HORMONE (SENSITIVE TSH), VITAMIN D 25 TOTAL,         VITAMIN B12    (F51.04) Psychophysiological insomnia  Plan: CBC, COMPREHENSIVE METABOLIC PNL, FASTING,         LIPID PANEL, MAGNESIUM, THYROID STIMULATING         HORMONE (SENSITIVE TSH), VITAMIN D 25 TOTAL,         VITAMIN B12    (K21.9) Gastroesophageal reflux disease without esophagitis  Plan: CBC, COMPREHENSIVE METABOLIC PNL, FASTING,         LIPID PANEL, MAGNESIUM, THYROID STIMULATING         HORMONE (SENSITIVE TSH), VITAMIN D 25 TOTAL,         VITAMIN B12    (E03.9) Acquired hypothyroidism  Plan: CBC, COMPREHENSIVE METABOLIC PNL, FASTING,         LIPID PANEL, MAGNESIUM, THYROID STIMULATING         HORMONE (SENSITIVE TSH), VITAMIN D 25 TOTAL,         VITAMIN B12    (F17.210) Tobacco dependence due to cigarettes    (F17.200) Nicotine dependence    (Z78.9) Weight loss advised    (E66.01,  Z68.43) Morbid obesity with BMI of 50.0-59.9, adult (CMS HCC)    (E55.9) Vitamin D deficiency  Plan: CBC, COMPREHENSIVE METABOLIC PNL, FASTING,         LIPID PANEL, MAGNESIUM, THYROID STIMULATING         HORMONE (SENSITIVE TSH), VITAMIN D 25 TOTAL,         VITAMIN  B12    (E53.8) B12 deficiency  Plan: CBC, COMPREHENSIVE METABOLIC PNL, FASTING,         LIPID PANEL, MAGNESIUM, THYROID STIMULATING         HORMONE (SENSITIVE TSH), VITAMIN D 25 TOTAL,         VITAMIN B12         Problem List Items Addressed This Visit          Cardiovascular System    Hypertriglyceridemia - Primary     Continue lovastatin 20 mg daily to lower triglycerides levels.  Advise to limit high fat foods, processed foods and fast foods in diet.  Discussed side effects.  Rechecked labs in 3 months.         Relevant Orders    CBC    COMPREHENSIVE METABOLIC PNL, FASTING    LIPID PANEL    MAGNESIUM    THYROID STIMULATING HORMONE (SENSITIVE TSH)    VITAMIN D 25 TOTAL    VITAMIN B12       Respiratory    Mild sleep apnea     No CPAP recommended. Advise to continue daily nasal spray, strongly advsied smoking cessation and encourage 10% weight loss goal.  Advised regular schedule aerobic exercise and strength training.  Advised to sleep on stomach or side. Avoid alcohol and sedatives.  Advised to set aside 8 hours each to sleep and discussed sleep training.          Relevant Orders    CBC    COMPREHENSIVE METABOLIC PNL, FASTING    LIPID PANEL    MAGNESIUM  THYROID STIMULATING HORMONE (SENSITIVE TSH)    VITAMIN D 25 TOTAL    VITAMIN B12       Neurologic    Insomnia     Continue Ambien 5 mg q.h.s..  Discussed sleep hygiene.  Advise the importance of being on a sleep routine.         Relevant Orders    CBC    COMPREHENSIVE METABOLIC PNL, FASTING    LIPID PANEL    MAGNESIUM    THYROID STIMULATING HORMONE (SENSITIVE TSH)    VITAMIN D 25 TOTAL    VITAMIN B12       Digestive    Gastroesophageal reflux disease without esophagitis     Symptoms well controlled with Protonix 40 mg daily.  Continue current medications.         Relevant Orders    CBC    COMPREHENSIVE METABOLIC PNL, FASTING    LIPID PANEL    MAGNESIUM    THYROID STIMULATING HORMONE (SENSITIVE TSH)    VITAMIN D 25 TOTAL    VITAMIN B12       Endocrine    Acquired  hypothyroidism     Continue levothyroxine 200 mcg daily.  Patient denies missed doses.  Advised to take levothyroxine in the morning on an empty stomach and wait 30 minutes prior to eating/drinking.         Relevant Orders    CBC    COMPREHENSIVE METABOLIC PNL, FASTING    LIPID PANEL    MAGNESIUM    THYROID STIMULATING HORMONE (SENSITIVE TSH)    VITAMIN D 25 TOTAL    VITAMIN B12    Vitamin D deficiency     Advised vitamin-D 4000 IU daily.         Relevant Orders    CBC    COMPREHENSIVE METABOLIC PNL, FASTING    LIPID PANEL    MAGNESIUM    THYROID STIMULATING HORMONE (SENSITIVE TSH)    VITAMIN D 25 TOTAL    VITAMIN B12    B12 deficiency     Continue vitamin B12 1000 mcg daily.         Relevant Orders    CBC    COMPREHENSIVE METABOLIC PNL, FASTING    LIPID PANEL    MAGNESIUM    THYROID STIMULATING HORMONE (SENSITIVE TSH)    VITAMIN D 25 TOTAL    VITAMIN B12       Psychiatric    Tobacco dependence due to cigarettes     Failed therapy on Changix, prescribed by Dr Massie Maroon, [facility].  Patient reports she is not interested in smoking cessation at time.            Other    Morbid obesity with BMI of 50.0-59.9, adult (CMS HCC)    Nicotine dependence    Weight loss advised        Further treatment pending labs.  Continue current medications.  Advised a low-fat/low sodium diet, advised 150 minutes of scheduled weekly physical activity as tolerated.  Advised to maintain a healthy weight.     Return in about 1 week (around 12/02/2022) for 1 week for weight management; 3 mo routine visit.    Stormy Fabian, NP-C     Portions of this note may be dictated using voice recognition software or a dictation service. Variances in spelling and vocabulary are possible and unintentional. Not all errors are caught/corrected. Please notify the Thereasa Parkin if any discrepancies are noted or if the meaning of any statement is  not clear.

## 2022-11-25 NOTE — Assessment & Plan Note (Signed)
No CPAP recommended. Advise to continue daily nasal spray, strongly advsied smoking cessation and encourage 10% weight loss goal.  Advised regular schedule aerobic exercise and strength training.  Advised to sleep on stomach or side. Avoid alcohol and sedatives.  Advised to set aside 8 hours each to sleep and discussed sleep training.

## 2022-11-25 NOTE — Assessment & Plan Note (Signed)
Advised vitamin-D 4000 IU daily.

## 2022-11-29 ENCOUNTER — Ambulatory Visit (RURAL_HEALTH_CENTER): Payer: Self-pay | Admitting: Family

## 2022-12-02 ENCOUNTER — Ambulatory Visit (RURAL_HEALTH_CENTER): Payer: Managed Care, Other (non HMO) | Admitting: Family

## 2022-12-10 ENCOUNTER — Other Ambulatory Visit: Payer: Self-pay

## 2022-12-10 ENCOUNTER — Encounter (HOSPITAL_PSYCHIATRIC): Payer: Self-pay | Admitting: PHYSICIAN ASSISTANT

## 2022-12-10 ENCOUNTER — Ambulatory Visit: Payer: Managed Care, Other (non HMO) | Attending: PHYSICIAN ASSISTANT | Admitting: PHYSICIAN ASSISTANT

## 2022-12-10 VITALS — BP 138/89 | HR 91 | Resp 18 | Ht 65.0 in | Wt 298.0 lb

## 2022-12-10 DIAGNOSIS — F410 Panic disorder [episodic paroxysmal anxiety] without agoraphobia: Secondary | ICD-10-CM | POA: Insufficient documentation

## 2022-12-10 DIAGNOSIS — G4700 Insomnia, unspecified: Secondary | ICD-10-CM

## 2022-12-10 DIAGNOSIS — F411 Generalized anxiety disorder: Secondary | ICD-10-CM | POA: Insufficient documentation

## 2022-12-10 DIAGNOSIS — F329 Major depressive disorder, single episode, unspecified: Secondary | ICD-10-CM | POA: Insufficient documentation

## 2022-12-10 DIAGNOSIS — F5104 Psychophysiologic insomnia: Secondary | ICD-10-CM | POA: Insufficient documentation

## 2022-12-10 MED ORDER — ZOLPIDEM ER 12.5 MG TABLET,EXTENDED RELEASE,MULTIPHASE
12.5000 mg | ORAL_TABLET | Freq: Every evening | ORAL | 1 refills | Status: DC | PRN
Start: 2022-12-10 — End: 2023-01-07

## 2022-12-10 MED ORDER — CITALOPRAM 40 MG TABLET
40.0000 mg | ORAL_TABLET | Freq: Every day | ORAL | 1 refills | Status: DC
Start: 2022-12-10 — End: 2023-03-12

## 2022-12-10 MED ORDER — LORAZEPAM 0.5 MG TABLET
0.5000 mg | ORAL_TABLET | Freq: Three times a day (TID) | ORAL | 3 refills | Status: DC | PRN
Start: 2022-12-10 — End: 2023-03-12

## 2022-12-10 NOTE — Patient Instructions (Signed)
Take medications as prescribed, avoid drugs and alcohol, call office if symptoms worsen or problems arise.  [phone].

## 2022-12-10 NOTE — Progress Notes (Addendum)
BEHAVIORAL MEDICINE, [facility]  [address]  [place] [address]  Operated by [facility]  Progress Note    Name: Lindsey Daugherty MRN:  [id number]   Date: 12/10/2022 DOB:  25-Dec-1975 (47 y.o.)           Chief Complaint: Panic Disorder, Generalized Anxiety, Insomnia, and Major Depression    Subjective:   Patient reports that the house is hers now legally, however, she has been battling hornets, She has been stung in her sleep and is highly allergic. The pest control came again this week. It has been a very emotional month for her. Gaining the house, was a closure for her regarding her father's passing. This entire summer, her nephew has travelled all over the country and during the week, they work. The weekend before last, they were able to come over and started on the garage. Cannot do the basement because of the wasps. She changed the living room to make it hers. Her boyfriend is going to paint the foundation of the house and the shutters, but she still misses her father everyday. She was in the bed all day Saturday. She has a new schedule in a new department at work.  Mood " tired, but not that bad today".  Medications: Working well with no ill effects, except the Ambien.   Appetite: She has not had an appetite.   Sleep: She is not sleeping as well. She has been taking 1.5 of the Ambien and it is not working. She has tried Ambien, Sonata, Ramelteon, and Lunesta in the past without good effect. She is waking up at 3AM. She is interested in the CR version of Ambien.   Energy: Poor with amotivation.  Stressors: Work, Solicitor, and Therapist, music.    Patient Active Problem List   Diagnosis    Acquired hypothyroidism    Tobacco dependence due to cigarettes    Gastroesophageal reflux disease without esophagitis    Insomnia    Vitamin D deficiency    B12 deficiency    Morbid obesity with BMI of 50.0-59.9, adult (CMS HCC)    Hypertriglyceridemia    Environmental  and seasonal allergies    Major depression    Generalized anxiety disorder with panic attacks    Panic disorder without agoraphobia    Mild sleep apnea    Normal gynecologic examination    Nontoxic single thyroid nodule    Noninflammatory disorder of vagina    Non-toxic multinodular goiter    Nicotine dependence    Need for immunization against influenza    Menorrhagia    Family history of malignant neoplasm of digestive organ    Dizziness and giddiness    Benign neoplasm of thyroid gland    Back pain    Arthralgia of shoulder    Iron deficiency anemia    Abnormal findings on diagnostic imaging of other specified body structures    Weight loss advised    Encounter for weight management     Past Medical History:   Diagnosis Date    Acute vaginitis 08/23/2022    Bereavement 05/08/2022    Dermatitis, unspecified 08/23/2022    Dysmenorrhea 08/23/2022    Edema of lower extremity 07/24/2021    Esophageal reflux     Family history of cardiovascular disease 08/23/2022    Insomnia     Lightheadedness 08/23/2022    Migraine     Snoring 08/23/2022    Thyroid cancer (CMS HCC) 2018  Vaginal odor 08/23/2022     Past Surgical History:   Procedure Laterality Date    HX CHOLECYSTECTOMY  02/20/2022    HX PARTIAL THYROIDECTOMY Right 2018    No chemo/radiation; dx thyroid cancer    HX PARTIAL THYROIDECTOMY Left 2020    no chemo/radiation    HX TONSILLECTOMY  1981     Family History   Problem Relation Name Age of Onset    No Known Problems Mother      Diabetes type II Father      Hypertension (High Blood Pressure) Father      Elevated Lipids Father      No Known Problems Sister      No Known Problems Brother      No Known Problems Half-Sister      No Known Problems Half-Brother      No Known Problems Maternal Aunt      No Known Problems Maternal Uncle      No Known Problems Paternal Aunt      No Known Problems Paternal Uncle      No Known Problems Maternal Grandmother      No Known Problems Maternal Grandfather      No Known Problems  Paternal Grandmother      No Known Problems Paternal Grandfather      No Known Problems Daughter      No Known Problems Son       Social History     Socioeconomic History    Marital status: Divorced    Number of children: 0    Highest education level: Associate degree: academic program   Occupational History    Occupation: Pharmaceutical   Tobacco Use    Smoking status: Every Day     Current packs/day: 0.50     Average packs/day: 0.5 packs/day for 29.6 years (14.8 ttl pk-yrs)     Types: Cigarettes     Start date: 1995    Smokeless tobacco: Never   Vaping Use    Vaping status: Never Used   Substance and Sexual Activity    Alcohol use: Never    Drug use: Never    Sexual activity: Not Currently     Partners: Male     Comment: LMP: 12/04/21 ON ORAL CONTRACEPTION; G2P0A2     Social Determinants of Health     Financial Resource Strain: Low Risk  (11/25/2022)    Financial Resource Strain     SDOH Financial: No   Transportation Needs: Low Risk  (11/25/2022)    Transportation Needs     SDOH Transportation: No   Social Connections: Low Risk  (11/25/2022)    Social Connections     SDOH Social Isolation: 5 or more times a week   Intimate Partner Violence: Low Risk  (11/25/2022)    Intimate Partner Violence     SDOH Domestic Violence: No   Housing Stability: Low Risk  (11/25/2022)    Housing Stability     SDOH Housing Situation: I have housing.     SDOH Housing Worry: No      Loratadine, Pseudoephedrine, Sulfamethoxazole-trimethoprim, Vitamin e, Vitamin e-safflower oil, and Vitamin e (d-alpha tocopherol)   Current Outpatient Medications   Medication Sig    ascorbic acid (SUPER C PO) Take 1 Tablet by mouth Once a day Super c with zinc    citalopram (CELEXA) 40 mg Oral Tablet Take 1 Tablet (40 mg total) by mouth Once a day for 180 days    drospirenone, contraceptive, (SLYND) 4  mg (28) Oral Tablet Take 1 Tablet (4 mg total) by mouth Once a day    fluconazole (DIFLUCAN) 150 mg Oral Tablet     furosemide (LASIX) 20 mg Oral Tablet Take 1 Tablet  (20 mg total) by mouth Once per day as needed    gabapentin (NEURONTIN) 300 mg Oral Capsule Take 1 Capsule (300 mg total) by mouth Once a day    levothyroxine (SYNTHROID) 200 mcg Oral Tablet Take 1 Tablet (200 mcg total) by mouth Every morning for 90 days    LORazepam (ATIVAN) 0.5 mg Oral Tablet Take 1 Tablet (0.5 mg total) by mouth Three times a day as needed for Anxiety    lovastatin (MEVACOR) 20 mg Oral Tablet Take 1 Tablet (20 mg total) by mouth Every evening with dinner for 90 days    pantoprazole (PROTONIX) 40 mg Oral Tablet, Delayed Release (E.C.) Take 1 Tablet (40 mg total) by mouth Once a day for 90 days    prenatal 21/iron fu/folic acid (PRENATAL COMPLETE ORAL) Take 1 Tablet by mouth Once a day    valACYclovir (VALTREX) 500 mg Oral Tablet Take 1 Tablet (500 mg total) by mouth Once a day    zolpidem (AMBIEN) 10 mg Oral Tablet Take 1 Tablet (10 mg total) by mouth Every night    ZYRTEC 10 mg Oral Tablet Take 1 Tablet (10 mg total) by mouth      Objective :  BP 138/89 (Site: Left Arm, Patient Position: Sitting)   Pulse 91   Resp 18   Ht 1.651 m (5\' 5" )   Wt 135 kg (298 lb)   BMI 49.59 kg/m       PHQ Total Score  PHQ 2 Total: 4  PHQ 9 Total: 17  Interpretation of Total Score: 15-19 Moderate/Severe depression             09/11/2022     8:33 AM 10/10/2022     9:07 AM 12/10/2022     9:49 AM   Most Recent PHQ-9 Scores   PHQ 9 Total 17 14 17         Mental Status Exam  AXOX4. Casual dress, calm, well-groomed. No SI, HI, AVH, delusions, or paranoia. Thoughts are logical, coherent, and goal-directed. Good eye contact. Speech is normal in rate and tone. Mood is "tired, but not that bad today" affect congruent. No psychomotor agitation or retardation, cogwheel rigidity, or abnormal movements. Gait is normal. Attention, concentration, and memory are good. No cognitive deficits noted. Judgment and insight are fair. Calculation and abstraction are within normal limits.     Data Reviewed  I have reviewed patient's  previous note medical, surgical, family, and social history in detail today,     Assessment  Panic Disorder, Generalized Anxiety, Insomnia, and Major Depression    Plan  Continue the following medications:  Celexa 40 mg daily for mood.  Ativan 0.5 mg tid/prn for anxiety.  Discontinue Ambien, as per patient, ineffective.  Start Ambien CR 12.5 mg qhs/prn for sleep.     Patient has been unable to tolerate CR formulation of Ambien, even if she takes the medication early, she is falling asleep late and feels bad the following morning. She requests going back to Ambien 10 mg qhs/prn for sleep. Will write new prescription and send it to her pharmacy.      Follow up  Return to clinic in 3 months.    Maren Beach, PA-C

## 2022-12-12 ENCOUNTER — Other Ambulatory Visit: Payer: Self-pay

## 2022-12-12 ENCOUNTER — Ambulatory Visit (RURAL_HEALTH_CENTER): Payer: Managed Care, Other (non HMO) | Attending: Family | Admitting: Family

## 2022-12-12 ENCOUNTER — Encounter (RURAL_HEALTH_CENTER): Payer: Self-pay | Admitting: Family

## 2022-12-12 VITALS — BP 124/80 | HR 84 | Temp 98.8°F | Ht 65.0 in | Wt 295.0 lb

## 2022-12-12 DIAGNOSIS — Z7689 Persons encountering health services in other specified circumstances: Secondary | ICD-10-CM | POA: Insufficient documentation

## 2022-12-12 DIAGNOSIS — Z6841 Body Mass Index (BMI) 40.0 and over, adult: Secondary | ICD-10-CM | POA: Insufficient documentation

## 2022-12-12 DIAGNOSIS — E039 Hypothyroidism, unspecified: Secondary | ICD-10-CM

## 2022-12-12 MED ORDER — MOUNJARO 2.5 MG/0.5 ML SUBCUTANEOUS PEN INJECTOR
2.5000 mg | PEN_INJECTOR | SUBCUTANEOUS | 1 refills | Status: AC
Start: 2022-12-12 — End: 2023-01-11

## 2022-12-12 NOTE — Progress Notes (Signed)
[facility] MEDICINE [facility]  [facility] FAMILY MEDICINE      Lindsey Daugherty  MRN: [id number]  DOB: 01-Sep-1975  Date of Service: 12/23/2022    CHIEF COMPLAINT  Chief Complaint   Patient presents with    Follow Up     Wt management  follow up       SUBJECTIVE  Lindsey Daugherty is a 47 y.o. female who presents to clinic for weight management.  Patient reports she is going to [place] and [place] and is requesting a letter stating she can participate in weight management due to hx of thyroid cancer.     Review of Systems:  Positive ROS discussed in HPI, otherwise all other systems negative.      Medications:   ascorbic acid (SUPER C PO), Take 1 Tablet by mouth Once a day Super c with zinc  citalopram (CELEXA) 40 mg Oral Tablet, Take 1 Tablet (40 mg total) by mouth Once a day for 180 days  drospirenone, contraceptive, (SLYND) 4 mg (28) Oral Tablet, Take 1 Tablet (4 mg total) by mouth Once a day  fluconazole (DIFLUCAN) 150 mg Oral Tablet,   furosemide (LASIX) 20 mg Oral Tablet, Take 1 Tablet (20 mg total) by mouth Once per day as needed  gabapentin (NEURONTIN) 300 mg Oral Capsule, Take 1 Capsule (300 mg total) by mouth Once a day  levothyroxine (SYNTHROID) 200 mcg Oral Tablet, Take 1 Tablet (200 mcg total) by mouth Every morning for 90 days  LORazepam (ATIVAN) 0.5 mg Oral Tablet, Take 1 Tablet (0.5 mg total) by mouth Three times a day as needed for Anxiety  lovastatin (MEVACOR) 20 mg Oral Tablet, Take 1 Tablet (20 mg total) by mouth Every evening with dinner for 90 days  pantoprazole (PROTONIX) 40 mg Oral Tablet, Delayed Release (E.C.), Take 1 Tablet (40 mg total) by mouth Once a day for 90 days  prenatal 21/iron fu/folic acid (PRENATAL COMPLETE ORAL), Take 1 Tablet by mouth Once a day  valACYclovir (VALTREX) 500 mg Oral Tablet, Take 1 Tablet (500 mg total) by mouth Once a day  zolpidem (AMBIEN CR) 12.5 mg Oral Tablet, Multiphasic Release, Take 1 Tablet (12.5 mg total) by mouth Every night as needed for  Insomnia  ZYRTEC 10 mg Oral Tablet, Take 1 Tablet (10 mg total) by mouth    No facility-administered medications prior to visit.      Allergies:   Allergies   Allergen Reactions    Loratadine Shortness of Breath and  Other Adverse Reaction (Add comment)     claritan D    Pseudoephedrine Shortness of Breath    Sulfamethoxazole-Trimethoprim Itching and Hives/ Urticaria    Vitamin E Rash    Vitamin E-Safflower Oil     Vitamin E (D-Alpha Tocopherol) Itching         OBJECTIVE  BP 124/80 (Site: Left Arm, Patient Position: Sitting, Cuff Size: Adult) Comment (Site): wrist  Pulse 84   Temp 37.1 C (98.8 F) (Tympanic)   Ht 1.651 m (5\' 5" )   Wt 134 kg (295 lb)   SpO2 98%   BMI 49.09 kg/m       Physical Exam  Vitals reviewed.   Constitutional:       Appearance: She is obese.   Cardiovascular:      Rate and Rhythm: Normal rate.      Pulses: Normal pulses.      Heart sounds: Normal heart sounds.   Pulmonary:      Effort: Pulmonary effort  is normal.   Abdominal:      General: Bowel sounds are normal.      Palpations: Abdomen is soft.      Comments: Waist circumference: 46 inches   Musculoskeletal:      Cervical back: Neck supple.   Neurological:      General: No focal deficit present.      Mental Status: She is alert and oriented to person, place, and time.   Psychiatric:         Mood and Affect: Mood normal.         Behavior: Behavior normal.           ASSESSMENT/PLAN  (Z76.89) Encounter for weight management  (primary encounter diagnosis)    (E66.01,  Z68.42) Morbid obesity with BMI of 45.0-49.9, adult (CMS HCC)    (E03.9) Acquired hypothyroidism       Problem List Items Addressed This Visit          Endocrine    Acquired hypothyroidism     Continue levothyroxine 200 mcg daily.  Patient denies missed doses.  Advised to take levothyroxine in the morning on an empty stomach and wait 30 minutes prior to eating/drinking.            Other    Morbid obesity with BMI of 45.0-49.9, adult (CMS HCC)     Advised a  low-fat/low-sodium diet, advised 150 minutes of scheduled weekly physical activity as tolerated continue with weight loss efforts.           Encounter for weight management - Primary     Start Zepbound 2.5mg  every week for weight loss. Advise to hydrate well with water.  Discussed risk of constipation, bowel obstruction/ileus and pancreatitis.  Discussed the importance of increasing protein in diet and exercising daily to keep muscle mass and aid in weight loss.  Patient understands consuming high fat foods will increase side effects and increase risks of ileus and pancreatitis.  If patient develops abdominal pain medication will need to be stopped and go to ER. Patient verbalized understanding.            Orders Placed This Encounter    tirzepatide (MOUNJARO) 2.5 mg/0.5 mL Subcutaneous Pen Injector        Return if symptoms worsen or fail to improve.    Stormy Fabian, NP-C 12/23/2022, 16:55

## 2022-12-23 ENCOUNTER — Encounter (RURAL_HEALTH_CENTER): Payer: Self-pay | Admitting: Family

## 2022-12-23 NOTE — Assessment & Plan Note (Signed)
Start Zepbound 2.5mg  every week for weight loss. Advise to hydrate well with water.  Discussed risk of constipation, bowel obstruction/ileus and pancreatitis.  Discussed the importance of increasing protein in diet and exercising daily to keep muscle mass and aid in weight loss.  Patient understands consuming high fat foods will increase side effects and increase risks of ileus and pancreatitis.  If patient develops abdominal pain medication will need to be stopped and go to ER. Patient verbalized understanding.

## 2022-12-23 NOTE — Assessment & Plan Note (Signed)
Continue levothyroxine 200 mcg daily.  Patient denies missed doses.  Advised to take levothyroxine in the morning on an empty stomach and wait 30 minutes prior to eating/drinking.

## 2022-12-23 NOTE — Assessment & Plan Note (Signed)
Advised a low-fat/low-sodium diet, advised 150 minutes of scheduled weekly physical activity as tolerated continue with weight loss efforts.

## 2023-01-07 ENCOUNTER — Encounter (HOSPITAL_PSYCHIATRIC): Payer: Self-pay | Admitting: PHYSICIAN ASSISTANT

## 2023-01-07 MED ORDER — ZOLPIDEM 10 MG TABLET
10.0000 mg | ORAL_TABLET | Freq: Every evening | ORAL | 1 refills | Status: DC
Start: 2023-01-07 — End: 2023-03-12

## 2023-01-07 NOTE — Addendum Note (Signed)
Addended by: Maren Beach RENEE on: 01/07/2023 02:35 PM     Modules accepted: Orders

## 2023-02-25 ENCOUNTER — Encounter (RURAL_HEALTH_CENTER): Payer: Self-pay | Admitting: Family

## 2023-02-25 ENCOUNTER — Other Ambulatory Visit: Payer: Self-pay

## 2023-02-25 ENCOUNTER — Ambulatory Visit (RURAL_HEALTH_CENTER): Payer: Managed Care, Other (non HMO) | Attending: Family | Admitting: Family

## 2023-02-25 VITALS — BP 128/76 | HR 83 | Temp 97.9°F | Ht 65.0 in | Wt 296.0 lb

## 2023-02-25 DIAGNOSIS — Z6841 Body Mass Index (BMI) 40.0 and over, adult: Secondary | ICD-10-CM | POA: Insufficient documentation

## 2023-02-25 DIAGNOSIS — E039 Hypothyroidism, unspecified: Secondary | ICD-10-CM | POA: Insufficient documentation

## 2023-02-25 DIAGNOSIS — G4730 Sleep apnea, unspecified: Secondary | ICD-10-CM | POA: Insufficient documentation

## 2023-02-25 DIAGNOSIS — E781 Pure hyperglyceridemia: Secondary | ICD-10-CM | POA: Insufficient documentation

## 2023-02-25 DIAGNOSIS — F17210 Nicotine dependence, cigarettes, uncomplicated: Secondary | ICD-10-CM | POA: Insufficient documentation

## 2023-02-25 DIAGNOSIS — E559 Vitamin D deficiency, unspecified: Secondary | ICD-10-CM | POA: Insufficient documentation

## 2023-02-25 DIAGNOSIS — F17200 Nicotine dependence, unspecified, uncomplicated: Secondary | ICD-10-CM

## 2023-02-25 DIAGNOSIS — K219 Gastro-esophageal reflux disease without esophagitis: Secondary | ICD-10-CM | POA: Insufficient documentation

## 2023-02-25 DIAGNOSIS — E538 Deficiency of other specified B group vitamins: Secondary | ICD-10-CM | POA: Insufficient documentation

## 2023-02-25 NOTE — Assessment & Plan Note (Signed)
Continue lovastatin 20 mg daily to lower triglycerides levels.  Advise to limit high fat foods, processed foods and fast foods in diet.  Discussed side effects.  Rechecked labs in 3 months.

## 2023-02-25 NOTE — Nursing Note (Signed)
Patient here for 3 month follow-up and medication refills.

## 2023-02-25 NOTE — Assessment & Plan Note (Signed)
Symptoms well controlled with Protonix 40 mg daily.  Continue current medications.

## 2023-02-25 NOTE — Assessment & Plan Note (Signed)
Advised a low-fat/low-sodium diet, advised 150 minutes of scheduled weekly physical activity as tolerated continue with weight loss efforts.

## 2023-02-25 NOTE — Assessment & Plan Note (Signed)
Further treatment pending labs.

## 2023-02-25 NOTE — Progress Notes (Signed)
FAMILY MEDICINE, [facility] FAMILY MEDICINE [facility]  [address]  [place] [address]  Operated by [facility]     Name: Lindsey Daugherty MRN:  [id number]   Date of Birth: 06-Feb-1976 Age: 47 y.o.   Date: 02/25/2023  Time: 11:41     Provider: Stormy Fabian, NP-C    Reason for visit: Follow Up 3 Months (3 month follow up)      History of Present Illness:  Lindsey Daugherty is a 47 y.o. female presenting with chronic disease management.      Patient reports overall she is doing very well.  Patient reports she recently purchased a home.  Denies chest pain, palpitations or shortness breath.  Denies fever, cough, nausea, vomiting or diarrhea.  Denies recent hospitalizations or surgeries.      Patient Active Problem List    Diagnosis Date Noted    Panic disorder without agoraphobia 08/23/2022    Mild sleep apnea 08/23/2022    Normal gynecologic examination 08/23/2022    Nontoxic single thyroid nodule 08/23/2022    Noninflammatory disorder of vagina 08/23/2022    Non-toxic multinodular goiter 08/23/2022    Nicotine dependence 08/23/2022    Need for immunization against influenza 08/23/2022    Menorrhagia 08/23/2022     [name], GYN      Benign neoplasm of thyroid gland 08/23/2022    Back pain 08/23/2022    Arthralgia of shoulder 08/23/2022    Iron deficiency anemia 08/23/2022    Abnormal findings on diagnostic imaging of other specified body structures 08/23/2022    Weight loss advised 08/23/2022    Encounter for weight management 08/23/2022    Major depression 06/05/2022    Generalized anxiety disorder with panic attacks 06/05/2022     Managed by [facility]      Environmental and seasonal allergies 05/08/2022    Hypertriglyceridemia 03/25/2022    Acquired hypothyroidism 09/19/2021    Tobacco dependence due to cigarettes 09/19/2021    Gastroesophageal reflux disease without esophagitis 09/19/2021    Vitamin D deficiency 09/19/2021    B12 deficiency 09/19/2021    Morbid  obesity with BMI of 45.0-49.9, adult (CMS HCC) 09/19/2021    Insomnia 06/11/2021     Managed by [facility]         Historical Data    Past Medical History:  Past Medical History:   Diagnosis Date    Acute vaginitis 08/23/2022    Bereavement 05/08/2022    Dermatitis, unspecified 08/23/2022    Dizziness and giddiness 08/23/2022    Dysmenorrhea 08/23/2022    Edema of lower extremity 07/24/2021    Esophageal reflux     Family history of cardiovascular disease 08/23/2022    Family history of malignant neoplasm of digestive organ 08/23/2022    Insomnia     Lightheadedness 08/23/2022    Migraine     Snoring 08/23/2022    Thyroid cancer (CMS HCC) 2018    Vaginal odor 08/23/2022     Past Surgical History:  Past Surgical History:   Procedure Laterality Date    HX CHOLECYSTECTOMY  02/20/2022    HX PARTIAL THYROIDECTOMY Right 2018    No chemo/radiation; dx thyroid cancer    HX PARTIAL THYROIDECTOMY Left 2020    no chemo/radiation    HX TONSILLECTOMY  1981     Allergies:  Allergies   Allergen Reactions    Loratadine Shortness of Breath and  Other Adverse Reaction (Add comment)  claritan D    Pseudoephedrine Shortness of Breath    Sulfamethoxazole-Trimethoprim Itching and Hives/ Urticaria    Vitamin E Rash    Vitamin E-Safflower Oil     Vitamin E (D-Alpha Tocopherol) Itching     Medications:  Current Outpatient Medications   Medication Sig    ascorbic acid (SUPER C PO) Take 1 Tablet by mouth Once a day Super c with zinc    citalopram (CELEXA) 40 mg Oral Tablet Take 1 Tablet (40 mg total) by mouth Once a day for 180 days    drospirenone, contraceptive, (SLYND) 4 mg (28) Oral Tablet Take 1 Tablet (4 mg total) by mouth Once a day    fluconazole (DIFLUCAN) 150 mg Oral Tablet     fluticasone propionate (FLONASE) 50 mcg/actuation Nasal Spray, Suspension 1 Spray Once a day    furosemide (LASIX) 20 mg Oral Tablet Take 1 Tablet (20 mg total) by mouth Once per day as needed    gabapentin (NEURONTIN) 300 mg Oral Capsule  Take 1 Capsule (300 mg total) by mouth Once a day    levothyroxine (SYNTHROID) 200 mcg Oral Tablet Take 1 Tablet (200 mcg total) by mouth Every morning for 90 days    LORazepam (ATIVAN) 0.5 mg Oral Tablet Take 1 Tablet (0.5 mg total) by mouth Three times a day as needed for Anxiety    lovastatin (MEVACOR) 20 mg Oral Tablet Take 1 Tablet (20 mg total) by mouth Every evening with dinner for 90 days    pantoprazole (PROTONIX) 40 mg Oral Tablet, Delayed Release (E.C.) Take 1 Tablet (40 mg total) by mouth Once a day for 90 days    prenatal 21/iron fu/folic acid (PRENATAL COMPLETE ORAL) Take 1 Tablet by mouth Once a day    valACYclovir (VALTREX) 500 mg Oral Tablet Take 1 Tablet (500 mg total) by mouth Once a day    zolpidem (AMBIEN) 10 mg Oral Tablet Take 1 Tablet (10 mg total) by mouth Every night    ZYRTEC 10 mg Oral Tablet Take 1 Tablet (10 mg total) by mouth     Family History:  Family Medical History:       Problem Relation (Age of Onset)    Diabetes type II Father    Elevated Lipids Father    Hypertension (High Blood Pressure) Father    No Known Problems Mother, Sister, Brother, Half-Sister, Half-Brother, Maternal Aunt, Maternal Uncle, Paternal Aunt, Paternal Uncle, Maternal Grandmother, Maternal Grandfather, Paternal Grandmother, Paternal Grandfather, Daughter, Son            Social History:  Social History     Socioeconomic History    Marital status: Divorced    Number of children: 0    Highest education level: Associate degree: academic program   Occupational History    Occupation: Pharmaceutical   Tobacco Use    Smoking status: Every Day     Current packs/day: 0.50     Average packs/day: 0.5 packs/day for 29.8 years (14.9 ttl pk-yrs)     Types: Cigarettes     Start date: 1995    Smokeless tobacco: Never    Tobacco comments:     Declines cessation   Vaping Use    Vaping status: Never Used   Substance and Sexual Activity    Alcohol use: Never    Drug use: Never    Sexual activity: Not Currently     Partners: Male      Comment: LMP: 12/04/21 ON ORAL CONTRACEPTION; [id number]  Social Determinants of Health     Financial Resource Strain: Low Risk  (11/25/2022)    Financial Resource Strain     SDOH Financial: No   Transportation Needs: Low Risk  (11/25/2022)    Transportation Needs     SDOH Transportation: No   Social Connections: Low Risk  (11/25/2022)    Social Connections     SDOH Social Isolation: 5 or more times a week   Intimate Partner Violence: Low Risk  (11/25/2022)    Intimate Partner Violence     SDOH Domestic Violence: No   Housing Stability: Low Risk  (11/25/2022)    Housing Stability     SDOH Housing Situation: I have housing.     SDOH Housing Worry: No           Review of Systems:  Any pertinent Review of Systems as addressed in the HPI above.    Physical Exam:  Vital Signs:  Vitals:    02/25/23 0924   BP: 128/76   Pulse: 83   Temp: 36.6 C (97.9 F)   SpO2: 97%   Weight: 134 kg (296 lb)   Height: 1.651 m (5\' 5" )   BMI: 49.36     Physical Exam  Vitals reviewed.   Constitutional:       Appearance: Normal appearance. She is obese.   HENT:      Head: Normocephalic.      Mouth/Throat:      Pharynx: Oropharynx is clear.   Eyes:      Extraocular Movements: Extraocular movements intact.      Conjunctiva/sclera: Conjunctivae normal.      Pupils: Pupils are equal, round, and reactive to light.   Neck:      Thyroid: No thyroid mass, thyromegaly or thyroid tenderness.   Cardiovascular:      Rate and Rhythm: Normal rate and regular rhythm.      Pulses: Normal pulses.      Heart sounds: Normal heart sounds, S1 normal and S2 normal.   Pulmonary:      Effort: Pulmonary effort is normal.      Breath sounds: Normal breath sounds.   Abdominal:      General: Bowel sounds are normal.      Palpations: Abdomen is soft.   Musculoskeletal:         General: Normal range of motion.      Cervical back: Normal range of motion and neck supple.      Right lower leg: No edema.      Left lower leg: No edema.   Skin:     General: Skin is warm and dry.    Neurological:      General: No focal deficit present.      Mental Status: She is alert and oriented to person, place, and time. Mental status is at baseline.      Cranial Nerves: Cranial nerves 2-12 are intact.      Motor: Motor function is intact.      Coordination: Coordination is intact.      Gait: Gait is intact.   Psychiatric:         Mood and Affect: Mood normal.         Behavior: Behavior normal. Behavior is cooperative.         Thought Content: Thought content normal.         Cognition and Memory: Cognition normal.         Judgment: Judgment normal.  Assessment/Plan:  (E78.1) Hypertriglyceridemia  (primary encounter diagnosis)  Plan: CBC, COMPREHENSIVE METABOLIC PNL, FASTING,         LIPID PANEL, MAGNESIUM, THYROID STIMULATING         HORMONE (SENSITIVE TSH), VITAMIN D 25 TOTAL,         VITAMIN B12, FERRITIN, IRON TRANSFERRIN AND         TIBC    (K21.9) Gastroesophageal reflux disease without esophagitis  Plan: CBC, COMPREHENSIVE METABOLIC PNL, FASTING,         LIPID PANEL, MAGNESIUM, THYROID STIMULATING         HORMONE (SENSITIVE TSH), VITAMIN D 25 TOTAL,         VITAMIN B12, FERRITIN, IRON TRANSFERRIN AND         TIBC    (E03.9) Acquired hypothyroidism  Plan: CBC, COMPREHENSIVE METABOLIC PNL, FASTING,         LIPID PANEL, MAGNESIUM, THYROID STIMULATING         HORMONE (SENSITIVE TSH), VITAMIN D 25 TOTAL,         VITAMIN B12, FERRITIN, IRON TRANSFERRIN AND         TIBC    (F17.210) Tobacco dependence due to cigarettes  Plan: CBC, COMPREHENSIVE METABOLIC PNL, FASTING,         LIPID PANEL, MAGNESIUM, THYROID STIMULATING         HORMONE (SENSITIVE TSH), VITAMIN D 25 TOTAL,         VITAMIN B12, FERRITIN, IRON TRANSFERRIN AND         TIBC    (E66.01,  Z68.42) Morbid obesity with BMI of 45.0-49.9, adult (CMS HCC)  Plan: CBC, COMPREHENSIVE METABOLIC PNL, FASTING,         LIPID PANEL, MAGNESIUM, THYROID STIMULATING         HORMONE (SENSITIVE TSH), VITAMIN D 25 TOTAL,         VITAMIN B12, FERRITIN, IRON  TRANSFERRIN AND         TIBC    (F17.200) Nicotine dependence  Plan: CBC, COMPREHENSIVE METABOLIC PNL, FASTING,         LIPID PANEL, MAGNESIUM, THYROID STIMULATING         HORMONE (SENSITIVE TSH), VITAMIN D 25 TOTAL,         VITAMIN B12, FERRITIN, IRON TRANSFERRIN AND         TIBC    (E53.8) B12 deficiency  Plan: CBC, COMPREHENSIVE METABOLIC PNL, FASTING,         LIPID PANEL, MAGNESIUM, THYROID STIMULATING         HORMONE (SENSITIVE TSH), VITAMIN D 25 TOTAL,         VITAMIN B12, FERRITIN, IRON TRANSFERRIN AND         TIBC    (E55.9) Vitamin D deficiency  Plan: CBC, COMPREHENSIVE METABOLIC PNL, FASTING,         LIPID PANEL, MAGNESIUM, THYROID STIMULATING         HORMONE (SENSITIVE TSH), VITAMIN D 25 TOTAL,         VITAMIN B12, FERRITIN, IRON TRANSFERRIN AND         TIBC    (G47.30) Mild sleep apnea           Problem List Items Addressed This Visit          Cardiovascular System    Hypertriglyceridemia - Primary     Continue lovastatin 20 mg daily to lower triglycerides levels.  Advise to limit high fat foods, processed foods and fast foods in diet.  Discussed  side effects.  Rechecked labs in 3 months.         Relevant Orders    CBC    COMPREHENSIVE METABOLIC PNL, FASTING    LIPID PANEL    MAGNESIUM    THYROID STIMULATING HORMONE (SENSITIVE TSH)    VITAMIN D 25 TOTAL    VITAMIN B12    FERRITIN    IRON TRANSFERRIN AND TIBC       Respiratory    Mild sleep apnea     No CPAP recommended. Advise to continue daily nasal spray, strongly advsied smoking cessation and encourage 10% weight loss goal.  Advised regular schedule aerobic exercise and strength training.  Advised to sleep on stomach or side. Avoid alcohol and sedatives.  Advised to set aside 8 hours each to sleep and discussed sleep training.             Digestive    Gastroesophageal reflux disease without esophagitis     Symptoms well controlled with Protonix 40 mg daily.  Continue current medications.         Relevant Orders    CBC    COMPREHENSIVE METABOLIC PNL,  FASTING    LIPID PANEL    MAGNESIUM    THYROID STIMULATING HORMONE (SENSITIVE TSH)    VITAMIN D 25 TOTAL    VITAMIN B12    FERRITIN    IRON TRANSFERRIN AND TIBC       Endocrine    Acquired hypothyroidism     Continue levothyroxine 200 mcg daily.  Patient denies missed doses.  Advised to take levothyroxine in the morning on an empty stomach and wait 30 minutes prior to eating/drinking.         Relevant Orders    CBC    COMPREHENSIVE METABOLIC PNL, FASTING    LIPID PANEL    MAGNESIUM    THYROID STIMULATING HORMONE (SENSITIVE TSH)    VITAMIN D 25 TOTAL    VITAMIN B12    FERRITIN    IRON TRANSFERRIN AND TIBC    Vitamin D deficiency     Advised vitamin-D 4000 IU daily.         Relevant Orders    CBC    COMPREHENSIVE METABOLIC PNL, FASTING    LIPID PANEL    MAGNESIUM    THYROID STIMULATING HORMONE (SENSITIVE TSH)    VITAMIN D 25 TOTAL    VITAMIN B12    FERRITIN    IRON TRANSFERRIN AND TIBC    B12 deficiency     Continue vitamin B12 1000 mcg daily.         Relevant Orders    CBC    COMPREHENSIVE METABOLIC PNL, FASTING    LIPID PANEL    MAGNESIUM    THYROID STIMULATING HORMONE (SENSITIVE TSH)    VITAMIN D 25 TOTAL    VITAMIN B12    FERRITIN    IRON TRANSFERRIN AND TIBC       Psychiatric    Tobacco dependence due to cigarettes     Failed therapy on Changix, prescribed by Dr Massie Maroon, [facility].  Patient reports she is not interested in smoking cessation at time.         Relevant Orders    CBC    COMPREHENSIVE METABOLIC PNL, FASTING    LIPID PANEL    MAGNESIUM    THYROID STIMULATING HORMONE (SENSITIVE TSH)    VITAMIN D 25 TOTAL    VITAMIN B12    FERRITIN    IRON TRANSFERRIN AND TIBC  Other    Morbid obesity with BMI of 45.0-49.9, adult (CMS HCC)     Advised a low-fat/low-sodium diet, advised 150 minutes of scheduled weekly physical activity as tolerated continue with weight loss efforts.           Relevant Orders    CBC    COMPREHENSIVE METABOLIC PNL, FASTING    LIPID PANEL    MAGNESIUM    THYROID STIMULATING  HORMONE (SENSITIVE TSH)    VITAMIN D 25 TOTAL    VITAMIN B12    FERRITIN    IRON TRANSFERRIN AND TIBC    Nicotine dependence    Relevant Orders    CBC    COMPREHENSIVE METABOLIC PNL, FASTING    LIPID PANEL    MAGNESIUM    THYROID STIMULATING HORMONE (SENSITIVE TSH)    VITAMIN D 25 TOTAL    VITAMIN B12    FERRITIN    IRON TRANSFERRIN AND TIBC     Further treatment pending labs.  Continue current medications.  Advised a low-fat/low sodium diet, advised 150 minutes of scheduled weekly physical activity as tolerated.  Advised to maintain a healthy weight.     Return in about 3 months (around 05/28/2023) for routine visit.    Stormy Fabian, NP-C     Portions of this note may be dictated using voice recognition software or a dictation service. Variances in spelling and vocabulary are possible and unintentional. Not all errors are caught/corrected. Please notify the Thereasa Parkin if any discrepancies are noted or if the meaning of any statement is not clear.

## 2023-02-25 NOTE — Assessment & Plan Note (Signed)
Advised vitamin-D 4000 IU daily.

## 2023-02-25 NOTE — Assessment & Plan Note (Signed)
Failed therapy on Changix, prescribed by Dr Massie Maroon, [facility].  Patient reports she is not interested in smoking cessation at time.

## 2023-02-25 NOTE — Assessment & Plan Note (Signed)
-

## 2023-02-25 NOTE — Assessment & Plan Note (Signed)
No CPAP recommended. Advise to continue daily nasal spray, strongly advsied smoking cessation and encourage 10% weight loss goal.  Advised regular schedule aerobic exercise and strength training.  Advised to sleep on stomach or side. Avoid alcohol and sedatives.  Advised to set aside 8 hours each to sleep and discussed sleep training.

## 2023-02-25 NOTE — Assessment & Plan Note (Signed)
Continue levothyroxine 200 mcg daily.  Patient denies missed doses.  Advised to take levothyroxine in the morning on an empty stomach and wait 30 minutes prior to eating/drinking.

## 2023-03-11 ENCOUNTER — Other Ambulatory Visit (HOSPITAL_PSYCHIATRIC): Payer: Self-pay | Admitting: PHYSICIAN ASSISTANT

## 2023-03-12 ENCOUNTER — Encounter (HOSPITAL_PSYCHIATRIC): Payer: Self-pay | Admitting: PHYSICIAN ASSISTANT

## 2023-03-12 ENCOUNTER — Other Ambulatory Visit: Payer: Self-pay

## 2023-03-12 ENCOUNTER — Ambulatory Visit: Payer: Managed Care, Other (non HMO) | Attending: PHYSICIAN ASSISTANT | Admitting: PHYSICIAN ASSISTANT

## 2023-03-12 VITALS — BP 151/67 | HR 85 | Resp 19 | Ht 65.0 in | Wt 292.0 lb

## 2023-03-12 DIAGNOSIS — F329 Major depressive disorder, single episode, unspecified: Secondary | ICD-10-CM | POA: Insufficient documentation

## 2023-03-12 DIAGNOSIS — F410 Panic disorder [episodic paroxysmal anxiety] without agoraphobia: Secondary | ICD-10-CM | POA: Insufficient documentation

## 2023-03-12 DIAGNOSIS — G4700 Insomnia, unspecified: Secondary | ICD-10-CM

## 2023-03-12 DIAGNOSIS — F5104 Psychophysiologic insomnia: Secondary | ICD-10-CM | POA: Insufficient documentation

## 2023-03-12 DIAGNOSIS — Z79899 Other long term (current) drug therapy: Secondary | ICD-10-CM

## 2023-03-12 DIAGNOSIS — F411 Generalized anxiety disorder: Secondary | ICD-10-CM | POA: Insufficient documentation

## 2023-03-12 MED ORDER — PRAZOSIN 1 MG CAPSULE
1.0000 mg | ORAL_CAPSULE | Freq: Every evening | ORAL | 3 refills | Status: DC
Start: 2023-03-12 — End: 2023-05-21

## 2023-03-12 MED ORDER — ZOLPIDEM 10 MG TABLET
10.0000 mg | ORAL_TABLET | Freq: Every evening | ORAL | 3 refills | Status: DC
Start: 2023-03-12 — End: 2023-05-21

## 2023-03-12 MED ORDER — LORAZEPAM 0.5 MG TABLET
0.5000 mg | ORAL_TABLET | Freq: Three times a day (TID) | ORAL | 3 refills | Status: DC | PRN
Start: 2023-03-12 — End: 2023-05-21

## 2023-03-12 MED ORDER — CITALOPRAM 40 MG TABLET
40.0000 mg | ORAL_TABLET | Freq: Every day | ORAL | 1 refills | Status: DC
Start: 2023-03-12 — End: 2023-05-21
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
## 2023-03-12 NOTE — Patient Instructions (Signed)
Take medications as prescribed, avoid drugs and alcohol, call office if symptoms worsen or problems arise.  [phone].

## 2023-03-12 NOTE — Telephone Encounter (Signed)
If you don't care to approbe or deny. Thank you!

## 2023-04-04 ENCOUNTER — Other Ambulatory Visit (RURAL_HEALTH_CENTER): Payer: Self-pay | Admitting: Family

## 2023-04-17 ENCOUNTER — Other Ambulatory Visit: Payer: Self-pay

## 2023-04-17 ENCOUNTER — Encounter (HOSPITAL_BASED_OUTPATIENT_CLINIC_OR_DEPARTMENT_OTHER): Payer: Self-pay

## 2023-04-17 ENCOUNTER — Emergency Department
Admission: EM | Admit: 2023-04-17 | Discharge: 2023-04-17 | Disposition: A | Discharge status: Home / Self Care | Payer: Managed Care, Other (non HMO) | Attending: Family | Admitting: Family

## 2023-04-17 DIAGNOSIS — Z1152 Encounter for screening for COVID-19: Secondary | ICD-10-CM | POA: Insufficient documentation

## 2023-04-17 DIAGNOSIS — J069 Acute upper respiratory infection, unspecified: Secondary | ICD-10-CM | POA: Insufficient documentation

## 2023-04-17 DIAGNOSIS — J209 Acute bronchitis, unspecified: Secondary | ICD-10-CM | POA: Insufficient documentation

## 2023-04-17 LAB — COVID-19, FLU A/B, RSV RAPID BY PCR
INFLUENZA VIRUS TYPE A: NOT DETECTED
INFLUENZA VIRUS TYPE B: NOT DETECTED
RESPIRATORY SYNCTIAL VIRUS (RSV): NOT DETECTED
SARS-CoV-2: NOT DETECTED

## 2023-04-17 MED ORDER — CEFDINIR 300 MG CAPSULE
300.0000 mg | ORAL_CAPSULE | Freq: Two times a day (BID) | ORAL | 0 refills | Status: AC
Start: 2023-04-17 — End: 2023-04-27

## 2023-04-17 MED ORDER — ALBUTEROL SULFATE 2.5 MG/3 ML (0.083 %) SOLUTION FOR NEBULIZATION
INHALATION_SOLUTION | RESPIRATORY_TRACT | Status: AC
Start: 2023-04-17 — End: 2023-04-17
  Filled 2023-04-17: qty 3

## 2023-04-17 MED ORDER — IPRATROPIUM 0.5 MG-ALBUTEROL 3 MG (2.5 MG BASE)/3 ML NEBULIZATION SOLN
3.0000 mL | INHALATION_SOLUTION | RESPIRATORY_TRACT | Status: DC
Start: 2023-04-17 — End: 2023-04-17

## 2023-04-17 MED ORDER — METHYLPREDNISOLONE 4 MG TABLETS IN A DOSE PACK
ORAL_TABLET | ORAL | 0 refills | Status: DC
Start: 2023-04-17 — End: 2023-05-28

## 2023-04-17 MED ORDER — ALBUTEROL SULFATE 2.5 MG/3 ML (0.083 %) SOLUTION FOR NEBULIZATION
2.5000 mg | INHALATION_SOLUTION | RESPIRATORY_TRACT | Status: DC | PRN
Start: 2023-04-17 — End: 2023-04-17
  Administered 2023-04-17: 2.5 mg via RESPIRATORY_TRACT

## 2023-04-17 NOTE — ED Provider Notes (Signed)
[facility] [place] - Emergency Department  ED Primary Provider Note  History of Present Illness   Chief Complaint   Patient presents with    Cold Symptoms     States went to [facility] on Monday and was told I have a sinus infection and prescribed azithromycin with 2 days remaining. States has runny nose and drainage with congestion and cough. Reports SOB. Denies COPD.     Arrival: The patient arrived by Car  Lindsey Daugherty is a 47 y.o. female who had concerns including Cold Symptoms. Pt states cough cold for a week. Was [facility] took 3 days Z-Pak not better . States interm sob when laying down and having coughing fits.   Review of Systems   Constitutional: No fever, chills or weakness   Skin: No rash or diaphoresis  HENT: No headaches, + sore throat, congestion  Eyes: No vision changes or photophobia   Cardio: No chest pain, palpitations or leg swelling   Respiratory: + cough, wheezing inter SOB  GI:  No nausea, vomiting or stool changes  GU:  No dysuria, hematuria, or increased frequency  MSK: No muscle aches, joint or back pain  Neuro: No seizures, LOC, numbness, tingling, or focal weakness  Psychiatric: No depression, SI or substance abuse  All other systems reviewed and are negative.    History Reviewed This Encounter: all  noted and reviewed.     Physical Exam   ED Triage Vitals [04/17/23 1502]   BP (Non-Invasive) (!) 154/93   Heart Rate 66   Respiratory Rate (!) 22   Temperature 36.3 C (97.4 F)   SpO2 100 %   Weight 132 kg (292 lb)   Height 1.651 m (5\' 5" )       Constitutional:  47 y.o. female who appears in no distress. Normal color, no cyanosis.   HENT:   Head: Normocephalic and atraumatic.   Mouth/Throat: Oropharynx is redness and moist.   Eyes: EOMI, PERRL   Neck: Trachea midline. Neck supple.  Cardiovascular: RRR, No murmurs, rubs or gallops. Intact distal pulses.  Pulmonary/Chest: BS equal bilaterally. No respiratory distress. No wheezes, rales or chest tenderness.    Abdominal: Bowel sounds present and normal. Abdomen soft, no tenderness, no rebound and no guarding.  Back: No midline spinal tenderness, no paraspinal tenderness, no CVA tenderness.           Musculoskeletal: No edema, tenderness or deformity.  Skin: warm and dry. No rash, erythema, pallor or cyanosis  Psychiatric: normal mood and affect. Behavior is normal.   Neurological: Patient keenly alert and responsive, easily able to raise eyebrows, facial muscles/expressions symmetric, speaking in fluent sentences, moving all extremities equally and fully, normal gait  Patient Data     Labs Ordered/Reviewed   COVID-19, FLU A/B, RSV RAPID BY PCR - Normal    Narrative:     Results are for the simultaneous qualitative identification of SARS-CoV-2 (formerly 2019-nCoV), Influenza A, Influenza B, and RSV RNA. These etiologic agents are generally detectable in nasopharyngeal and nasal swabs during the ACUTE PHASE of infection. Hence, this test is intended to be performed on respiratory specimens collected from individuals with signs and symptoms of upper respiratory tract infection who meet Centers for Disease Control and Prevention (CDC) clinical and/or epidemiological criteria for Coronavirus Disease 2019 (COVID-19) testing. CDC COVID-19 criteria for testing on human specimens is available at [facility] webpage information for Healthcare Professionals: Coronavirus Disease 2019 (COVID-19) ([employer]).     False-negative results may occur  if the virus has genomic mutations, insertions, deletions, or rearrangements or if performed very early in the course of illness. Otherwise, negative results indicate virus specific RNA targets are not detected, however negative results do not preclude SARS-CoV-2 infection/COVID-19, Influenza, or Respiratory syncytial virus infection. Results should not be used as the sole basis for patient management decisions. Negative results must be combined with  clinical observations, patient history, and epidemiological information. If upper respiratory tract infection is still suspected based on exposure history together with other clinical findings, re-testing should be considered.    Test methodology:   Cepheid Xpert Xpress SARS-CoV-2/Flu/RSV Assay real-time polymerase chain reaction (RT-PCR) test on the GeneXpert Dx and Xpert Xpress systems.     No orders to display     Medical Decision Making   Diff dx of  uri flu covid strep pneumonia.  Swabs neg pt states she dont want xray  , wants a neb and go home. Has home mdi. Denies cp pleuritic pain or sob. Felt better post neb no distress. Speaking in full sentences.             Clinical Impression   Upper respiratory tract infection, unspecified type (Primary)   Acute bronchitis, unspecified organism       Disposition: Discharged

## 2023-04-17 NOTE — ED Nurses Note (Signed)

## 2023-05-19 ENCOUNTER — Encounter (HOSPITAL_PSYCHIATRIC): Payer: Self-pay | Admitting: PHYSICIAN ASSISTANT

## 2023-05-21 ENCOUNTER — Encounter (HOSPITAL_PSYCHIATRIC): Payer: Self-pay | Admitting: PHYSICIAN ASSISTANT

## 2023-05-21 ENCOUNTER — Ambulatory Visit: Payer: Managed Care, Other (non HMO) | Attending: PHYSICIAN ASSISTANT | Admitting: PHYSICIAN ASSISTANT

## 2023-05-21 ENCOUNTER — Other Ambulatory Visit: Payer: Self-pay

## 2023-05-21 VITALS — BP 130/79 | HR 79 | Resp 18 | Ht 65.0 in | Wt 290.0 lb

## 2023-05-21 DIAGNOSIS — Z733 Stress, not elsewhere classified: Secondary | ICD-10-CM

## 2023-05-21 DIAGNOSIS — F17210 Nicotine dependence, cigarettes, uncomplicated: Secondary | ICD-10-CM

## 2023-05-21 DIAGNOSIS — F411 Generalized anxiety disorder: Secondary | ICD-10-CM | POA: Insufficient documentation

## 2023-05-21 DIAGNOSIS — Z635 Disruption of family by separation and divorce: Secondary | ICD-10-CM

## 2023-05-21 DIAGNOSIS — Z630 Problems in relationship with spouse or partner: Secondary | ICD-10-CM

## 2023-05-21 DIAGNOSIS — F410 Panic disorder [episodic paroxysmal anxiety] without agoraphobia: Secondary | ICD-10-CM | POA: Insufficient documentation

## 2023-05-21 DIAGNOSIS — Z634 Disappearance and death of family member: Secondary | ICD-10-CM

## 2023-05-21 DIAGNOSIS — G4700 Insomnia, unspecified: Secondary | ICD-10-CM

## 2023-05-21 DIAGNOSIS — F5104 Psychophysiologic insomnia: Secondary | ICD-10-CM | POA: Insufficient documentation

## 2023-05-21 DIAGNOSIS — F329 Major depressive disorder, single episode, unspecified: Secondary | ICD-10-CM | POA: Insufficient documentation

## 2023-05-21 MED ORDER — ZOLPIDEM 10 MG TABLET
10.0000 mg | ORAL_TABLET | Freq: Every evening | ORAL | 3 refills | Status: DC
Start: 2023-05-21 — End: 2023-06-13

## 2023-05-21 MED ORDER — LORAZEPAM 1 MG TABLET
1.0000 mg | ORAL_TABLET | Freq: Three times a day (TID) | ORAL | 3 refills | Status: DC | PRN
Start: 2023-05-21 — End: 2023-06-13

## 2023-05-21 MED ORDER — CITALOPRAM 40 MG TABLET
40.0000 mg | ORAL_TABLET | Freq: Every day | ORAL | 1 refills | Status: DC
Start: 2023-05-21 — End: 2023-06-13

## 2023-05-21 MED ORDER — PRAZOSIN 1 MG CAPSULE
1.0000 mg | ORAL_CAPSULE | Freq: Every evening | ORAL | 3 refills | Status: DC
Start: 2023-05-21 — End: 2023-06-13
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
## 2023-05-21 NOTE — Patient Instructions (Signed)
Take medications as prescribed, avoid drugs and alcohol, call office if symptoms worsen or problems arise.  [phone].

## 2023-05-26 ENCOUNTER — Telehealth (HOSPITAL_PSYCHIATRIC): Payer: Self-pay | Admitting: PHYSICIAN ASSISTANT

## 2023-05-26 NOTE — Telephone Encounter (Signed)
Rozelia called and said her work sent an e-mail or fax on Thursday requesting information for shortterm disability.   She said she was copied on the e-mail and she hadn't seen where you responded or not.  She said she could explain what she needs if you could call her.  Her number is [phone].

## 2023-05-28 ENCOUNTER — Other Ambulatory Visit: Payer: Self-pay

## 2023-05-28 ENCOUNTER — Ambulatory Visit (RURAL_HEALTH_CENTER): Payer: Managed Care, Other (non HMO) | Attending: Family | Admitting: Family

## 2023-05-28 ENCOUNTER — Encounter (RURAL_HEALTH_CENTER): Payer: Self-pay | Admitting: Family

## 2023-05-28 VITALS — BP 126/74 | HR 81 | Temp 97.8°F | Wt 291.0 lb

## 2023-05-28 DIAGNOSIS — E781 Pure hyperglyceridemia: Secondary | ICD-10-CM | POA: Insufficient documentation

## 2023-05-28 DIAGNOSIS — G4730 Sleep apnea, unspecified: Secondary | ICD-10-CM | POA: Insufficient documentation

## 2023-05-28 DIAGNOSIS — G4700 Insomnia, unspecified: Secondary | ICD-10-CM | POA: Insufficient documentation

## 2023-05-28 DIAGNOSIS — F17210 Nicotine dependence, cigarettes, uncomplicated: Secondary | ICD-10-CM | POA: Insufficient documentation

## 2023-05-28 DIAGNOSIS — D509 Iron deficiency anemia, unspecified: Secondary | ICD-10-CM | POA: Insufficient documentation

## 2023-05-28 DIAGNOSIS — E039 Hypothyroidism, unspecified: Secondary | ICD-10-CM | POA: Insufficient documentation

## 2023-05-28 DIAGNOSIS — G8929 Other chronic pain: Secondary | ICD-10-CM | POA: Insufficient documentation

## 2023-05-28 DIAGNOSIS — K219 Gastro-esophageal reflux disease without esophagitis: Secondary | ICD-10-CM | POA: Insufficient documentation

## 2023-05-28 DIAGNOSIS — E538 Deficiency of other specified B group vitamins: Secondary | ICD-10-CM | POA: Insufficient documentation

## 2023-05-28 DIAGNOSIS — F17200 Nicotine dependence, unspecified, uncomplicated: Secondary | ICD-10-CM | POA: Insufficient documentation

## 2023-05-28 DIAGNOSIS — M2550 Pain in unspecified joint: Secondary | ICD-10-CM | POA: Insufficient documentation

## 2023-05-28 DIAGNOSIS — E559 Vitamin D deficiency, unspecified: Secondary | ICD-10-CM | POA: Insufficient documentation

## 2023-05-28 MED ORDER — ZYRTEC 10 MG TABLET
10.0000 mg | ORAL_TABLET | Freq: Every day | ORAL | 1 refills | Status: AC
Start: 2023-05-28 — End: 2023-10-17

## 2023-05-28 MED ORDER — FLUTICASONE PROPIONATE 50 MCG/ACTUATION NASAL SPRAY,SUSPENSION
1.0000 | Freq: Every day | NASAL | 1 refills | Status: AC
Start: 2023-05-28 — End: 2023-10-17

## 2023-05-28 MED ORDER — IBUPROFEN 600 MG TABLET
600.0000 mg | ORAL_TABLET | Freq: Three times a day (TID) | ORAL | 0 refills | Status: AC | PRN
Start: 2023-05-28 — End: 2023-07-11

## 2023-05-28 MED ORDER — LOVASTATIN 20 MG TABLET
20.0000 mg | ORAL_TABLET | Freq: Every evening | ORAL | 1 refills | Status: DC
Start: 2023-05-28 — End: 2023-12-25

## 2023-05-28 MED ORDER — PANTOPRAZOLE 40 MG TABLET,DELAYED RELEASE
40.0000 mg | DELAYED_RELEASE_TABLET | Freq: Every day | ORAL | 1 refills | Status: DC
Start: 2023-05-28 — End: 2024-01-12

## 2023-05-28 MED ORDER — LEVOTHYROXINE 200 MCG TABLET
200.0000 ug | ORAL_TABLET | Freq: Every morning | ORAL | 1 refills | Status: DC
Start: 2023-05-28 — End: 2023-08-13

## 2023-05-28 NOTE — Assessment & Plan Note (Signed)
Declined xray.  Start Ibuprofen 600 mg BID PRN.  Advise take OTC Tylenol PRN.  Advised daily physical activity as tolerated.

## 2023-05-28 NOTE — Assessment & Plan Note (Signed)
Advised vitamin-D 4000 IU daily.
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
## 2023-05-28 NOTE — Assessment & Plan Note (Signed)
Symptoms well controlled with Protonix 40 mg daily.  Continue current medications.

## 2023-05-28 NOTE — Assessment & Plan Note (Signed)
Continue levothyroxine 200 mcg daily.  Patient denies missed doses.  Advised to take levothyroxine in the morning on an empty stomach and wait 30 minutes prior to eating/drinking.

## 2023-05-28 NOTE — Assessment & Plan Note (Signed)
No CPAP recommended. Advise to continue daily nasal spray, strongly advsied smoking cessation and encourage 10% weight loss goal.  Advised regular schedule aerobic exercise and strength training.  Advised to sleep on stomach or side. Avoid alcohol and sedatives.  Advised to set aside 8 hours each to sleep and discussed sleep training.

## 2023-05-28 NOTE — Assessment & Plan Note (Signed)
Continue lovastatin 20 mg daily to lower triglycerides levels.  Advise to limit high fat foods, processed foods and fast foods in diet.  Discussed side effects.  Rechecked labs in 3 months.

## 2023-05-28 NOTE — Assessment & Plan Note (Signed)
 -  Continue vitamin B 12 1000 mcg daily

## 2023-05-28 NOTE — Assessment & Plan Note (Signed)
Discussed sleep hygiene.  Advise the importance of being on a sleep routine. Managed by [facility].

## 2023-05-28 NOTE — Assessment & Plan Note (Signed)
 Further treatment pending labs.

## 2023-05-28 NOTE — Nursing Note (Signed)
 Patient here for follow up with no new complaints.

## 2023-05-30 ENCOUNTER — Ambulatory Visit: Payer: Managed Care, Other (non HMO) | Attending: COUNSELOR-PROFESSIONAL | Admitting: COUNSELOR-PROFESSIONAL

## 2023-05-30 ENCOUNTER — Other Ambulatory Visit: Payer: Self-pay

## 2023-05-30 DIAGNOSIS — F410 Panic disorder [episodic paroxysmal anxiety] without agoraphobia: Secondary | ICD-10-CM

## 2023-05-30 DIAGNOSIS — F430 Acute stress reaction: Secondary | ICD-10-CM

## 2023-05-30 DIAGNOSIS — Z91410 Personal history of adult physical and sexual abuse: Secondary | ICD-10-CM

## 2023-05-30 DIAGNOSIS — Z91411 Personal history of adult psychological abuse: Secondary | ICD-10-CM

## 2023-05-30 DIAGNOSIS — F329 Major depressive disorder, single episode, unspecified: Secondary | ICD-10-CM

## 2023-05-30 DIAGNOSIS — Z630 Problems in relationship with spouse or partner: Secondary | ICD-10-CM

## 2023-05-30 DIAGNOSIS — F4321 Adjustment disorder with depressed mood: Secondary | ICD-10-CM

## 2023-05-30 DIAGNOSIS — Z634 Disappearance and death of family member: Secondary | ICD-10-CM

## 2023-05-30 DIAGNOSIS — F411 Generalized anxiety disorder: Secondary | ICD-10-CM

## 2023-05-30 DIAGNOSIS — F17200 Nicotine dependence, unspecified, uncomplicated: Secondary | ICD-10-CM
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
## 2023-06-12 ENCOUNTER — Ambulatory Visit: Payer: Managed Care, Other (non HMO) | Admitting: PHYSICIAN ASSISTANT

## 2023-06-13 ENCOUNTER — Encounter (HOSPITAL_PSYCHIATRIC): Payer: Self-pay | Admitting: PHYSICIAN ASSISTANT

## 2023-06-13 ENCOUNTER — Other Ambulatory Visit: Payer: Self-pay

## 2023-06-13 ENCOUNTER — Ambulatory Visit: Payer: Managed Care, Other (non HMO) | Attending: PHYSICIAN ASSISTANT | Admitting: PHYSICIAN ASSISTANT

## 2023-06-13 VITALS — BP 151/92 | HR 84 | Resp 18 | Ht 65.0 in | Wt 292.0 lb

## 2023-06-13 DIAGNOSIS — F430 Acute stress reaction: Secondary | ICD-10-CM | POA: Insufficient documentation

## 2023-06-13 DIAGNOSIS — F410 Panic disorder [episodic paroxysmal anxiety] without agoraphobia: Secondary | ICD-10-CM | POA: Insufficient documentation

## 2023-06-13 DIAGNOSIS — F329 Major depressive disorder, single episode, unspecified: Secondary | ICD-10-CM | POA: Insufficient documentation

## 2023-06-13 DIAGNOSIS — F4310 Post-traumatic stress disorder, unspecified: Secondary | ICD-10-CM | POA: Insufficient documentation

## 2023-06-13 DIAGNOSIS — F5104 Psychophysiologic insomnia: Secondary | ICD-10-CM | POA: Insufficient documentation

## 2023-06-13 DIAGNOSIS — F411 Generalized anxiety disorder: Secondary | ICD-10-CM | POA: Insufficient documentation

## 2023-06-13 MED ORDER — LORAZEPAM 1 MG TABLET
1.0000 mg | ORAL_TABLET | Freq: Three times a day (TID) | ORAL | 3 refills | Status: DC | PRN
Start: 2023-06-13 — End: 2023-07-11

## 2023-06-13 MED ORDER — CITALOPRAM 40 MG TABLET
40.0000 mg | ORAL_TABLET | Freq: Every day | ORAL | 1 refills | Status: DC
Start: 2023-06-13 — End: 2023-07-11

## 2023-06-13 MED ORDER — ZOLPIDEM 10 MG TABLET
10.0000 mg | ORAL_TABLET | Freq: Every evening | ORAL | 3 refills | Status: DC
Start: 2023-06-13 — End: 2023-07-11

## 2023-06-13 MED ORDER — PRAZOSIN 1 MG CAPSULE
1.0000 mg | ORAL_CAPSULE | Freq: Every evening | ORAL | 3 refills | Status: DC
Start: 2023-06-13 — End: 2023-07-11
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
## 2023-06-18 ENCOUNTER — Ambulatory Visit (HOSPITAL_PSYCHIATRIC): Payer: Self-pay | Admitting: PHYSICIAN ASSISTANT

## 2023-06-19 ENCOUNTER — Other Ambulatory Visit: Payer: Self-pay

## 2023-06-19 ENCOUNTER — Ambulatory Visit: Payer: Managed Care, Other (non HMO) | Attending: COUNSELOR-PROFESSIONAL | Admitting: COUNSELOR-PROFESSIONAL

## 2023-06-19 DIAGNOSIS — F329 Major depressive disorder, single episode, unspecified: Secondary | ICD-10-CM

## 2023-06-19 DIAGNOSIS — F410 Panic disorder [episodic paroxysmal anxiety] without agoraphobia: Secondary | ICD-10-CM

## 2023-06-19 DIAGNOSIS — F430 Acute stress reaction: Secondary | ICD-10-CM

## 2023-06-19 DIAGNOSIS — F17210 Nicotine dependence, cigarettes, uncomplicated: Secondary | ICD-10-CM

## 2023-06-19 DIAGNOSIS — F4310 Post-traumatic stress disorder, unspecified: Secondary | ICD-10-CM

## 2023-06-19 DIAGNOSIS — F411 Generalized anxiety disorder: Secondary | ICD-10-CM

## 2023-06-19 NOTE — Progress Notes (Signed)
 BEHAVIORAL [facility]ICINE[facility] [place]  [address]  [place] [address]  Operated by [facility]    Name: Lindsey Daugherty MRN:  [id number]   Date: 06/19/2023 DOB:  July 31, 1975 (48 y.o.)        Time In: 0810  Time Out: 0920     Diagnosis:   (F17.210) Tobacco dependence due to cigarettes  (primary encounter diagnosis)    (F32.9) Major depression    (F41.1,  F41.0) Generalized anxiety disorder with panic attacks    (F43.0) Acute stress reaction with predominately emotional disturbance    (F41.0) Panic disorder without agoraphobia    (F43.10) PTSD (post-traumatic stress disorder)       Mental Status Examination:  Sensorium/Alertness: Alert  Orientation: Date , Person, Place, and Situation  Appearance: appears stated age in street clothes appropriately groomed  Psychomotor Activity: Normal  Abnormal Behaviors: None  Attitude Towards Examiner: Cooperative  Eye Contact: Normal  Speech: normal  Mood: depressed  Affect: Appropriate  Perception: normal  Though Process: linear/logical  Thought Content: Within Normal Limits  Impulse Control: Fair  Concentration/Calculation/Attention Span: impaired  How was the patient's Concentration/Calculation/Attention tested/assessed? Per observation and interview with patient   Recent Memory: grossly normal  Remote Memory: grossly normal  How was the patient's Remote Memory Tested/Assessed? Past Events, as it relates to history  Intelligence/Fund of Knowledge:  Average  How was the patient's Intelligence/Fund of Knowledge Tested/Assessed? based on history.  Judgement: Fair  How was the patient's Judgement Tested/Assessed? Per patient's behavior/history of present illness  Insight: Adequate  How was the patient's Insight Tested/Assessed? Understanding of severity of illness/history of present illness     Risk Assessment:     Based upon patient presentation and therapist observation during session, patient appears not to be SI/HI.  In  the event of increase in SI or HI, I recommended the patient use the following resources to reduce risk of harm to self or others.  Call 9-1-1  Go to the nearest emergency room  Call the National Suicide Prevention Lifeline (988)  Suicide Prevention Lifeline Online Chat - [email or web address]  Crisis Call: [phone] or (304) 325-HOPE    [x]  Client denies all areas of risk. No contrary clinical indications present.   Area of Risk:     Level of Risk:      Intent to Act:    Plan to Act:    Means to Act:  []  Low      []  Yes     []  Yes     []  Yes  []  Medium     []  No     []  No     []  No  []  High     []  Not Applicable   []  Not Applicable   []  Not Applicable  []  Imminent    Risk Factors:   Protective Factors:     Current Outpatient Medications   Medication Sig    ascorbic acid (SUPER C PO) Take 1 Tablet by mouth Once a day Super c with zinc    citalopram (CELEXA) 40 mg Oral Tablet Take 1 Tablet (40 mg total) by mouth Once a day for 180 days    drospirenone, contraceptive, (SLYND) 4 mg (28) Oral Tablet Take 1 Tablet (4 mg total) by mouth Once a day    fluticasone propionate (FLONASE) 50 mcg/actuation Nasal Spray, Suspension Administer 1 Spray into each nostril Once a day for 90 days    furosemide (LASIX) 20  mg Oral Tablet Take 1 Tablet (20 mg total) by mouth Once per day as needed    gabapentin (NEURONTIN) 300 mg Oral Capsule Take 1 Capsule (300 mg total) by mouth Once a day    Ibuprofen (MOTRIN) 600 mg Oral Tablet Take 1 Tablet (600 mg total) by mouth Three times a day as needed for Pain for up to 30 days    levothyroxine (SYNTHROID) 200 mcg Oral Tablet Take 1 Tablet (200 mcg total) by mouth Every morning for 90 days    LORazepam (ATIVAN) 1 mg Oral Tablet Take 1 Tablet (1 mg total) by mouth Three times a day as needed for Anxiety    lovastatin (MEVACOR) 20 mg Oral Tablet Take 1 Tablet (20 mg total) by mouth Every evening with dinner for 90 days    pantoprazole (PROTONIX) 40 mg Oral Tablet,  Delayed Release (E.C.) Take 1 Tablet (40 mg total) by mouth Once a day for 90 days    prazosin (MINIPRESS) 1 mg Oral Capsule Take 1 Capsule (1 mg total) by mouth Every evening    prenatal 21/iron fu/folic acid (PRENATAL COMPLETE ORAL) Take 1 Tablet by mouth Once a day    valACYclovir (VALTREX) 500 mg Oral Tablet Take 1 Tablet (500 mg total) by mouth Once a day    zolpidem (AMBIEN) 10 mg Oral Tablet Take 1 Tablet (10 mg total) by mouth Every night    ZYRTEC 10 mg Oral Tablet Take 1 Tablet (10 mg total) by mouth Once a day for 90 days      ,   Symptom Description/Subjective Report: Lindsey Daugherty presents today for follow-up of MDD, GAD, PTSD, PHQ-9 score was a 23; GAD-7 score is a 21, both criteria showing severe endorsements of anxiety and depression. Ct reports that she is struggling with setting boundaries and communicating her needs appropriately to her boyfriend who recently totaled her care and is in jail on a parole violation. She reports mixed emotions related to his incarceration and frustration with herself for not setting boundaries that she needs. Ct has agreed to continue breathing skills and to work on a journal entry where she writes down ways that she can choose herself and show up for herself like she does the people in her life that she cares for.    Objective Content: In this session, clinician on helping the client develop self-awareness and healthier coping mechanisms. Clinician guided the client through journaling exercises to facilitate emotional expression and self-reflection, which will allow her to recognize patterns of self-sacrifice. We explored the importance of setting boundaries and clinician provided tools for assertive communication to empower the client in maintaining those boundaries. I also educated the client on recognizing manipulation, love bombing, and gaslighting, helping her understand these toxic behaviors and how to protect herself. Throughout,clinician introduced various coping  skills such as mindfulness and cognitive reframing to help the client manage emotional triggers, ultimately supporting her in shifting from a self-sacrificial mindset to one that prioritizes self-respect and self-worth. This work aims to promote healthier relationship dynamics and self-empowerment.    Interventions Used:   Motivational Interviewing, Supportive Reflection, Trauma Therapy, and Cognitive Reframing      Goal Progress: progressing    Additional Notes / Assessment:     Recommendation: Continue current therapeutic focus.    Plan: Lindsey Daugherty will benefit from regular psychotherapy. Patient will return to this clinic in 2 week(s) for follow-up.    Emeterio Reeve, Licensed Professional Counselor  06/19/2023, 08:14

## 2023-07-03 ENCOUNTER — Other Ambulatory Visit (HOSPITAL_PSYCHIATRIC): Payer: Self-pay | Admitting: PHYSICIAN ASSISTANT

## 2023-07-03 ENCOUNTER — Ambulatory Visit: Payer: Self-pay | Attending: COUNSELOR-PROFESSIONAL | Admitting: COUNSELOR-PROFESSIONAL

## 2023-07-03 ENCOUNTER — Other Ambulatory Visit: Payer: Self-pay

## 2023-07-03 DIAGNOSIS — F17210 Nicotine dependence, cigarettes, uncomplicated: Secondary | ICD-10-CM

## 2023-07-03 DIAGNOSIS — F329 Major depressive disorder, single episode, unspecified: Secondary | ICD-10-CM

## 2023-07-03 DIAGNOSIS — F430 Acute stress reaction: Secondary | ICD-10-CM

## 2023-07-03 DIAGNOSIS — F4310 Post-traumatic stress disorder, unspecified: Secondary | ICD-10-CM

## 2023-07-03 DIAGNOSIS — F411 Generalized anxiety disorder: Secondary | ICD-10-CM

## 2023-07-03 DIAGNOSIS — F410 Panic disorder [episodic paroxysmal anxiety] without agoraphobia: Secondary | ICD-10-CM

## 2023-07-03 NOTE — Progress Notes (Signed)
 BEHAVIORAL MEDICINE[facility] [place]  [address]  [place] [address]  Operated by [facility]    Name: Lindsey Daugherty MRN:  [id number]   Date: 07/03/2023 DOB:  07/27/1975 (48 y.o.)        Time In: 1406  Time Out: 1500     Diagnosis:   (F32.9) Major depression  (primary encounter diagnosis)    (F41.1,  F41.0) Generalized anxiety disorder with panic attacks    (F43.0) Acute stress reaction with predominately emotional disturbance    (F43.10) PTSD (post-traumatic stress disorder)    (F17.210) Tobacco dependence due to cigarettes       Mental Status Examination:  Sensorium/Alertness: Alert  Orientation: Date , Person, Place, and Situation  Appearance: in street clothes appropriately groomed  Psychomotor Activity: Normal  Abnormal Behaviors: None  Attitude Towards Examiner: Cooperative  Eye Contact: Normal  Speech: normal  Mood: appropriate to circumstances  Affect: Appropriate  Perception: normal  Though Process: linear/logical  Thought Content: Within Normal Limits  Impulse Control: Fair  Concentration/Calculation/Attention Span: within normal limits  How was the patient's Concentration/Calculation/Attention tested/assessed? Per observation and interview with patient   Recent Memory: grossly normal  Remote Memory: grossly normal  How was the patient's Remote Memory Tested/Assessed? Past Events, as it relates to history  Intelligence/Fund of Knowledge:  Average  How was the patient's Intelligence/Fund of Knowledge Tested/Assessed? based on history.  Judgement: Fair  How was the patient's Judgement Tested/Assessed? Per patient's behavior/history of present illness  Insight: Adequate  How was the patient's Insight Tested/Assessed? Understanding of severity of illness/history of present illness     Risk Assessment:     Based upon patient presentation and therapist observation during session, patient appears not to be SI/HI.  In the event of increase in SI or HI,  I recommended the patient use the following resources to reduce risk of harm to self or others.  Call 9-1-1  Go to the nearest emergency room  Call the National Suicide Prevention Lifeline (988)  Suicide Prevention Lifeline Online Chat - [email or web address]  Crisis Call: (615)030-5988 or (304) 325-HOPE    [x]  Client denies all areas of risk. No contrary clinical indications present.   Area of Risk:     Level of Risk:      Intent to Act:    Plan to Act:    Means to Act:  []  Low      []  Yes     []  Yes     []  Yes  []  Medium     []  No     []  No     []  No  []  High     [x]  Not Applicable   [x]  Not Applicable   [x]  Not Applicable  []  Imminent    Risk Factors:   Protective Factors:     Current Outpatient Medications   Medication Sig    ascorbic acid (SUPER C PO) Take 1 Tablet by mouth Once a day Super c with zinc    citalopram (CELEXA) 40 mg Oral Tablet Take 1 Tablet (40 mg total) by mouth Once a day for 180 days    drospirenone, contraceptive, (SLYND) 4 mg (28) Oral Tablet Take 1 Tablet (4 mg total) by mouth Once a day    fluticasone propionate (FLONASE) 50 mcg/actuation Nasal Spray, Suspension Administer 1 Spray into each nostril Once a day for 90 days    furosemide (LASIX) 20 mg Oral Tablet Take 1 Tablet (20  mg total) by mouth Once per day as needed    gabapentin (NEURONTIN) 300 mg Oral Capsule Take 1 Capsule (300 mg total) by mouth Once a day    levothyroxine (SYNTHROID) 200 mcg Oral Tablet Take 1 Tablet (200 mcg total) by mouth Every morning for 90 days    LORazepam (ATIVAN) 1 mg Oral Tablet Take 1 Tablet (1 mg total) by mouth Three times a day as needed for Anxiety    lovastatin (MEVACOR) 20 mg Oral Tablet Take 1 Tablet (20 mg total) by mouth Every evening with dinner for 90 days    pantoprazole (PROTONIX) 40 mg Oral Tablet, Delayed Release (E.C.) Take 1 Tablet (40 mg total) by mouth Once a day for 90 days    prazosin (MINIPRESS) 1 mg Oral Capsule Take 1 Capsule (1 mg total) by mouth Every  evening    prenatal 21/iron fu/folic acid (PRENATAL COMPLETE ORAL) Take 1 Tablet by mouth Once a day    valACYclovir (VALTREX) 500 mg Oral Tablet Take 1 Tablet (500 mg total) by mouth Once a day    zolpidem (AMBIEN) 10 mg Oral Tablet Take 1 Tablet (10 mg total) by mouth Every night    ZYRTEC 10 mg Oral Tablet Take 1 Tablet (10 mg total) by mouth Once a day for 90 days        Symptom Description/Subjective Report: The patient presents today with symptoms consistent with Major Depressive Disorder (MDD), Generalized Anxiety Disorder (GAD), and Post-Traumatic Stress Disorder (PTSD). She reports having successfully set boundaries with her boyfriend, although she expresses ambivalence about ending the relationship, fearing the consequences of a breakup. She is concerned about her boyfriend driving a car registered in her name and his relapse into drinking.  The patient also reports an increase in smoking as a coping mechanism for her anxiety and depression. She describes a lack of motivation to engage in daily activities, such as reorganizing her home, and continues to struggle with processing the deaths of her mother and father, which she attributes to the ongoing daily stress she experiences. Despite these challenges, the patient was able to identify changes she needs to make to reduce her stress and verbalized a commitment to working on those changes moving forward.    Objective Content: The clinician conducted a check-in with the patient to assess symptoms and provided positive reinforcement for her efforts in setting appropriate boundaries. Today's session included psychoeducation on the use of "I" statements to effectively communicate her feelings, assert boundaries, and reduce conflict in relationships. The patient was receptive to this approach. Additionally, the patient expressed a desire to work on managing her anxiety and other concerns but emphasized taking smaller, manageable steps to address these issues.  Interventions focused on fostering self-efficacy, enhancing communication skills, and gradually addressing anxiety through a paced approach.    Interventions Used:   Psycho-Education, Trauma Therapy, and Communication Skills      Goal Progress: progressing    Additional Notes / Assessment:     Recommendation: Continue current therapeutic focus.    Plan: Lindsey Daugherty will benefit from regular psychotherapy. Patient will return to this clinic in 1 week(s) for follow-up.    Emeterio Reeve, Licensed Professional Counselor  07/03/2023, 14:06

## 2023-07-03 NOTE — Telephone Encounter (Signed)
 Refill too soon. Sent to pharmacy on 06/13/23 with 3 attached

## 2023-07-05 ENCOUNTER — Other Ambulatory Visit (HOSPITAL_PSYCHIATRIC): Payer: Self-pay | Admitting: PHYSICIAN ASSISTANT

## 2023-07-07 NOTE — Telephone Encounter (Signed)
 Refill too soon sent on 06/13/23 with 3 attached

## 2023-07-09 ENCOUNTER — Telehealth (HOSPITAL_PSYCHIATRIC): Payer: Self-pay | Admitting: PHYSICIAN ASSISTANT

## 2023-07-09 NOTE — Telephone Encounter (Signed)
 Lindsey Daugherty called and said her short term disability changed 06/21/23 and the new company does not have information from her time off from Feb 27th to March 27th. They are going to e-mail you with information that is needed. She will e-mail you as well with the info.  She is supposed to come in Friday and she will talk to you about it then.

## 2023-07-10 ENCOUNTER — Other Ambulatory Visit: Payer: Self-pay

## 2023-07-10 ENCOUNTER — Ambulatory Visit: Payer: Self-pay | Attending: COUNSELOR-PROFESSIONAL | Admitting: COUNSELOR-PROFESSIONAL

## 2023-07-10 DIAGNOSIS — F4310 Post-traumatic stress disorder, unspecified: Secondary | ICD-10-CM

## 2023-07-10 DIAGNOSIS — F17200 Nicotine dependence, unspecified, uncomplicated: Secondary | ICD-10-CM

## 2023-07-10 DIAGNOSIS — F410 Panic disorder [episodic paroxysmal anxiety] without agoraphobia: Secondary | ICD-10-CM

## 2023-07-10 DIAGNOSIS — F411 Generalized anxiety disorder: Secondary | ICD-10-CM

## 2023-07-10 DIAGNOSIS — F329 Major depressive disorder, single episode, unspecified: Secondary | ICD-10-CM

## 2023-07-10 DIAGNOSIS — F17210 Nicotine dependence, cigarettes, uncomplicated: Secondary | ICD-10-CM

## 2023-07-10 NOTE — Progress Notes (Signed)
 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    (F17.200) Nicotine dependence    (F43.10) PTSD (post-traumatic stress disorder)       Mental Status Examination:  Sensorium/Alertness: Alert  Orientation: Date , Person, Place, and Situation  Appearance: in street clothes appropriately groomed  Psychomotor Activity: Normal  Abnormal Behaviors: None  Attitude Towards Examiner: Cooperative  Eye Contact: Normal  Speech: normal  Mood: appropriate to circumstances  Affect: Appropriate  Perception: normal  Though Process: linear/logical  Thought Content: Within Normal Limits  Impulse Control: Fair  Concentration/Calculation/Attention Span: within normal limits  How was the patient's Concentration/Calculation/Attention tested/assessed? Per observation and interview with patient   Recent Memory: grossly normal  Remote Memory: grossly normal  How was the patient's Remote Memory Tested/Assessed? Past Events, as it relates to history  Intelligence/Fund of Knowledge:  Average  How was the patient's Intelligence/Fund of Knowledge Tested/Assessed? based on history.  Judgement: Fair  How was the patient's Judgement Tested/Assessed? Per patient's behavior/history of present illness  Insight: Adequate  How was the patient's Insight Tested/Assessed? Understanding of severity of illness/history of present illness     Risk Assessment:     Based upon patient presentation and therapist observation during session, patient appears not to be SI/HI.  In the event of increase in SI or  HI, I recommended the patient use the following resources to reduce risk of harm to self or others.  Call 9-1-1  Go to the nearest emergency room  Call the National Suicide Prevention Lifeline (988)  Suicide Prevention Lifeline Online Chat - [email or web address]  Crisis Call: (570) 599-3142 or (304) 325-HOPE    [x]  Client denies all areas of risk. No contrary clinical indications present.   Area of Risk:     Level of Risk:      Intent to Act:    Plan to Act:    Means to Act:  []  Low      []  Yes     []  Yes     []  Yes  []  Medium     []  No     []  No     []  No  []  High     [x]  Not Applicable   [x]  Not Applicable   [x]  Not Applicable  []  Imminent    Risk Factors:   Protective Factors:     Current Outpatient Medications   Medication Sig    ascorbic acid (SUPER C PO) Take 1 Tablet by mouth Once a day Super c with zinc    citalopram  (CELEXA ) 40 mg Oral Tablet Take 1 Tablet (40 mg total) by mouth Once a day for 180 days    drospirenone, contraceptive, (SLYND) 4 mg (28) Oral Tablet Take 1 Tablet (4 mg total) by mouth Once a day    fluticasone  propionate (FLONASE ) 50 mcg/actuation Nasal Spray, Suspension Administer 1 Spray into each nostril Once a day for 90 days    furosemide (LASIX) 20 mg Oral Tablet Take  1 Tablet (20 mg total) by mouth Once per day as needed    gabapentin (NEURONTIN) 300 mg Oral Capsule Take 1 Capsule (300 mg total) by mouth Once a day    levothyroxine  (SYNTHROID) 200 mcg Oral Tablet Take 1 Tablet (200 mcg total) by mouth Every morning for 90 days    LORazepam  (ATIVAN ) 1 mg Oral Tablet Take 1 Tablet (1 mg total) by mouth Three times a day as needed for Anxiety    lovastatin  (MEVACOR ) 20 mg Oral Tablet Take 1 Tablet (20 mg total) by mouth Every evening with dinner for 90 days    pantoprazole  (PROTONIX ) 40 mg Oral Tablet, Delayed Release (E.C.) Take 1 Tablet (40 mg total) by mouth Once a day for 90 days    prazosin  (MINIPRESS ) 1 mg Oral Capsule Take 1 Capsule (1 mg total) by mouth Every  evening    prenatal 21/iron fu/folic acid (PRENATAL COMPLETE ORAL) Take 1 Tablet by mouth Once a day    valACYclovir (VALTREX) 500 mg Oral Tablet Take 1 Tablet (500 mg total) by mouth Once a day    zolpidem  (AMBIEN ) 10 mg Oral Tablet Take 1 Tablet (10 mg total) by mouth Every night    ZYRTEC  10 mg Oral Tablet Take 1 Tablet (10 mg total) by mouth Once a day for 90 days        Symptom Description/Subjective Report: he patient presents today with symptoms consistent with Major Depressive Disorder (MDD), Generalized Anxiety Disorder (GAD), and Post-Traumatic Stress Disorder (PTSD). She reports struggling this week due to grieving her father, and she also pushed herself to go into the store and was triggered by seeing a funny shirt that reminded her of her Dad. She lasted about 15 minutes in the store before panicking and leaving. She was overstimulated. She also had a panic attack at the dentist. She has some increased motivation and was working in her basement until she saw a closet full of her Dads hats and it brought up a lot of angry feelings, angry that he left her and things of that nature. She reports that she feels most at peace at journal and agreed to journal these big feelings to keep herself inside her window of tolerance.    Objective Content: Clinician completed a check-in with the ct regarding her sx's as aided her in exploring her emotions and grief. Clinician completed psychoeducation related to the window of tolerance and hyper/hypo-arousal. Clinician encourage ct to journal her emotions, beginning with a fun or happy memory of her father this week and reflecting on how that memory makes her feel now. Then to follow that up with breathing and self-care. Also encouraged her to take her Ativan  as needed and restructure thoughts and fears of becoming "addicted."     Interventions Used:   Motivational Interviewing, Chartered certified accountant, and Cognitive Refocusing      Goal Progress:  progressing.    Additional Notes / Assessment:     Recommendation: Continue current therapeutic focus.    Plan: Lindsey Daugherty will benefit from regular psychotherapy. Patient will return to this clinic in 1 week(s) for follow-up.    Stephannie Ehlers, Licensed Professional Counselor  07/10/2023, 13:58

## 2023-07-11 ENCOUNTER — Ambulatory Visit: Payer: Self-pay | Attending: PHYSICIAN ASSISTANT | Admitting: PHYSICIAN ASSISTANT

## 2023-07-11 ENCOUNTER — Encounter (HOSPITAL_PSYCHIATRIC): Payer: Self-pay | Admitting: PHYSICIAN ASSISTANT

## 2023-07-11 VITALS — BP 141/83 | HR 83 | Resp 18 | Ht 65.0 in | Wt 290.0 lb

## 2023-07-11 DIAGNOSIS — F410 Panic disorder [episodic paroxysmal anxiety] without agoraphobia: Secondary | ICD-10-CM | POA: Insufficient documentation

## 2023-07-11 DIAGNOSIS — Z635 Disruption of family by separation and divorce: Secondary | ICD-10-CM

## 2023-07-11 DIAGNOSIS — F17200 Nicotine dependence, unspecified, uncomplicated: Secondary | ICD-10-CM | POA: Insufficient documentation

## 2023-07-11 DIAGNOSIS — F329 Major depressive disorder, single episode, unspecified: Secondary | ICD-10-CM | POA: Insufficient documentation

## 2023-07-11 DIAGNOSIS — F4310 Post-traumatic stress disorder, unspecified: Secondary | ICD-10-CM | POA: Insufficient documentation

## 2023-07-11 DIAGNOSIS — F411 Generalized anxiety disorder: Secondary | ICD-10-CM | POA: Insufficient documentation

## 2023-07-11 DIAGNOSIS — G4700 Insomnia, unspecified: Secondary | ICD-10-CM

## 2023-07-11 DIAGNOSIS — F17210 Nicotine dependence, cigarettes, uncomplicated: Secondary | ICD-10-CM | POA: Insufficient documentation

## 2023-07-11 MED ORDER — LORAZEPAM 1 MG TABLET
1.0000 mg | ORAL_TABLET | Freq: Three times a day (TID) | ORAL | 3 refills | Status: DC | PRN
Start: 2023-07-11 — End: 2023-08-01

## 2023-07-11 MED ORDER — ZOLPIDEM 10 MG TABLET
10.0000 mg | ORAL_TABLET | Freq: Every evening | ORAL | 3 refills | Status: DC
Start: 2023-07-11 — End: 2023-08-01

## 2023-07-11 MED ORDER — CITALOPRAM 40 MG TABLET
40.0000 mg | ORAL_TABLET | Freq: Every day | ORAL | 1 refills | Status: DC
Start: 2023-07-11 — End: 2023-08-01

## 2023-07-11 MED ORDER — PRAZOSIN 1 MG CAPSULE
1.0000 mg | ORAL_CAPSULE | Freq: Every evening | ORAL | 1 refills | Status: DC
Start: 2023-07-11 — End: 2023-09-01

## 2023-07-11 MED ORDER — ARIPIPRAZOLE 2 MG TABLET
2.0000 mg | ORAL_TABLET | Freq: Every day | ORAL | 0 refills | Status: DC
Start: 2023-07-11 — End: 2023-08-01

## 2023-07-11 NOTE — Progress Notes (Signed)
 BEHAVIORAL MEDICINE, [facility] [place]  [address]  [place] [address]  Operated by [facility]  Progress Note    Name: Lindsey Daugherty MRN:  [id number]   Date: 07/11/2023 DOB:  [birth year] (48 y.o.)           Chief Complaint: Generalized Anxiety, MDD, Panic Disorder, PTSD, and Insomnia.    Subjective:   Patient reports that she started counseling this week and is going to continue weekly. She has learned some breathing exercises and has stressed journaling for her. She was asked to think about one good moment that she and her father shared and then read it.. She and her father would always spring clean, so when she went downstairs and found all of his hats in a closet. She did try to push herself this week and went to [facility] and was able to go inside and stay there for fifteen minutes. She was triggered by the crowd and had to leave in a panic attack. She had to have a filling on her tooth, which triggered a panic attack. She had to stop the dentist in the middle of the procedure. She has been pushing herself to go to church, but last week, she felt like she didn't want anyone to talk to her or look at her. Her fur baby is not building back muscle, but he goes back to the vet on the 26th. She did finally get a replacement vehicle. It has been a flood of life stressors that have hit her one after the other. She had two baskets of laundry that she has not touched.   Mood: "blah".  Medications: Working, but she has a fear of taking the medication and driving, because her ex-husband was a drug addict and drove when high.  She has been on Lexapro-sleep eating, Wellbutrin-increased appetite, Prozac-made her feel hollow, Paxil-made her feel hollow, Cymbalta-didn't work. She has never tried Effexor.   Appetite: She eats once a day, usually in the evenings. It has affected her taking the Thyroid  medication first thing in the morning.   Sleep: She is no longer  sleeping on the floor, but has moved it up a little bit to make it easier for her dog. She is having vivid dreams, but with the Prazosin  has decreased the nightmares in frequency.   Energy: Low with amotivation.   Stressors: Dog's health, vet appointment. She feels that everything is a stressor right now.     Patient Active Problem List   Diagnosis    Acquired hypothyroidism    Tobacco dependence due to cigarettes    Gastroesophageal reflux disease without esophagitis    Insomnia    Vitamin D  deficiency    B12 deficiency    Morbid obesity with BMI of 45.0-49.9, adult (CMS HCC)    Hypertriglyceridemia    Environmental and seasonal allergies    Major depression    Generalized anxiety disorder with panic attacks    Panic disorder without agoraphobia    Mild sleep apnea    Normal gynecologic examination    Nontoxic single thyroid  nodule    Noninflammatory disorder of vagina    Non-toxic multinodular goiter    Nicotine dependence    Need for immunization against influenza    Menorrhagia    Benign neoplasm of thyroid  gland    Back pain    Chronic joint pain    Iron deficiency anemia    Abnormal findings on diagnostic imaging of other specified body  structures    Weight loss advised    Encounter for weight management    PTSD (post-traumatic stress disorder)     Past Medical History:   Diagnosis Date    Acute vaginitis 08/23/2022    Bereavement 05/08/2022    Dermatitis, unspecified 08/23/2022    Dizziness and giddiness 08/23/2022    Dysmenorrhea 08/23/2022    Edema of lower extremity 07/24/2021    Esophageal reflux     Family history of cardiovascular disease 08/23/2022    Family history of malignant neoplasm of digestive organ 08/23/2022    Insomnia     Lightheadedness 08/23/2022    Migraine     Snoring 08/23/2022    Thyroid  cancer (CMS HCC) 2018    Vaginal odor 08/23/2022     Past Surgical History:   Procedure Laterality Date    HX CHOLECYSTECTOMY  02/20/2022    HX PARTIAL THYROIDECTOMY Right 2018    No chemo/radiation;  dx thyroid  cancer    HX PARTIAL THYROIDECTOMY Left 2020    no chemo/radiation    HX TONSILLECTOMY  1981     Family History   Problem Relation Name Age of Onset    No Known Problems Mother      Diabetes type II Father      Hypertension (High Blood Pressure) Father      Elevated Lipids Father      No Known Problems Sister      No Known Problems Brother      No Known Problems Half-Sister      No Known Problems Half-Brother      No Known Problems Maternal Aunt      No Known Problems Maternal Uncle      No Known Problems Paternal Aunt      No Known Problems Paternal Uncle      No Known Problems Maternal Grandmother      No Known Problems Maternal Grandfather      No Known Problems Paternal Grandmother      No Known Problems Paternal Grandfather      No Known Problems Daughter      No Known Problems Son       Social History     Socioeconomic History    Marital status: Divorced    Number of children: 0    Highest education level: Associate degree: academic program   Occupational History    Occupation: Pharmaceutical   Tobacco Use    Smoking status: Every Day     Current packs/day: 0.50     Average packs/day: 0.5 packs/day for 30.2 years (15.1 ttl pk-yrs)     Types: Cigarettes     Start date: 1995    Smokeless tobacco: Never    Tobacco comments:     Declines cessation   Vaping Use    Vaping status: Never Used   Substance and Sexual Activity    Alcohol use: Never    Drug use: Never    Sexual activity: Not Currently     Partners: Male     Comment: LMP: 12/04/21 ON ORAL CONTRACEPTION; G2P0A2     Social Determinants of Health     Financial Resource Strain: Low Risk  (11/25/2022)    Financial Resource Strain     SDOH Financial: No   Transportation Needs: Low Risk  (11/25/2022)    Transportation Needs     SDOH Transportation: No   Social Connections: Low Risk  (11/25/2022)    Social Connections     SDOH Social  Isolation: 5 or more times a week   Intimate Partner Violence: Low Risk  (11/25/2022)    Intimate Partner Violence     SDOH  Domestic Violence: No   Housing Stability: Low Risk  (11/25/2022)    Housing Stability     SDOH Housing Situation: I have housing.     SDOH Housing Worry: No      Loratadine, Pseudoephedrine, Sulfamethoxazole-trimethoprim, Vitamin e, Vitamin e-safflower oil, and Vitamin e (d-alpha tocopherol)   Current Outpatient Medications   Medication Sig    ascorbic acid (SUPER C PO) Take 1 Tablet by mouth Once a day Super c with zinc    citalopram  (CELEXA ) 40 mg Oral Tablet Take 1 Tablet (40 mg total) by mouth Once a day for 180 days    drospirenone, contraceptive, (SLYND) 4 mg (28) Oral Tablet Take 1 Tablet (4 mg total) by mouth Daily    fluticasone  propionate (FLONASE ) 50 mcg/actuation Nasal Spray, Suspension Administer 1 Spray into each nostril Once a day for 90 days    furosemide (LASIX) 20 mg Oral Tablet Take 1 Tablet (20 mg total) by mouth Once per day as needed    gabapentin (NEURONTIN) 300 mg Oral Capsule Take 1 Capsule (300 mg total) by mouth Daily    Ibuprofen  (MOTRIN ) 600 mg Oral Tablet Take 1 Tablet (600 mg total) by mouth Three times a day as needed for Pain for up to 30 days    levothyroxine  (SYNTHROID) 200 mcg Oral Tablet Take 1 Tablet (200 mcg total) by mouth Every morning for 90 days    LORazepam  (ATIVAN ) 1 mg Oral Tablet Take 1 Tablet (1 mg total) by mouth Three times a day as needed for Anxiety    lovastatin  (MEVACOR ) 20 mg Oral Tablet Take 1 Tablet (20 mg total) by mouth Every evening with dinner for 90 days    pantoprazole  (PROTONIX ) 40 mg Oral Tablet, Delayed Release (E.C.) Take 1 Tablet (40 mg total) by mouth Once a day for 90 days    prazosin  (MINIPRESS ) 1 mg Oral Capsule Take 1 Capsule (1 mg total) by mouth Every evening    prenatal 21/iron fu/folic acid (PRENATAL COMPLETE ORAL) Take 1 Tablet by mouth Once a day    valACYclovir (VALTREX) 500 mg Oral Tablet Take 1 Tablet (500 mg total) by mouth Daily    zolpidem  (AMBIEN ) 10 mg Oral Tablet Take 1 Tablet (10 mg total) by mouth Every night    ZYRTEC  10 mg  Oral Tablet Take 1 Tablet (10 mg total) by mouth Once a day for 90 days      Objective :  BP (!) 141/83   Pulse 83   Resp 18   Ht 1.651 m (5\' 5" )   Wt 132 kg (290 lb)   BMI 48.26 kg/m       PHQ Total Score  PHQ 2 Total: 6  PHQ 9 Total: 21  Interpretation of Total Score: 20-27 Severe depression             06/13/2023    11:54 AM 06/19/2023     8:15 AM 07/11/2023    10:56 AM   Most Recent PHQ-9 Scores   PHQ 9 Total 24 23 21         Mental Status Exam  AXOX4. Casual dress, calm, well-groomed. No SI, HI, AVH, delusions, or paranoia. Thoughts are logical, coherent, and goal-directed. Good eye contact. Speech is normal in rate and tone. Mood is "okay" affect congruent. No psychomotor agitation or retardation, cogwheel rigidity,  or abnormal movements. Gait is normal. Attention, concentration, and memory are good. No cognitive deficits noted. Judgment and insight are fair. Calculation and abstraction are within normal limits.     Data Reviewed  I have reviewed patient's previous note medical, surgical, family, and social history in detail today,     Assessment  Generalized Anxiety, MDD, Panic Disorder, PTSD, and Insomnia.    Plan  Continue the following medications:  Celexa  40 mg daily for mood.  Begin Abilify  2 mg daily for mood.  Ativan  1 mg tid/prn for anxiety.  Ambien  10 mg qhs/prn for sleep.   Prazosin  1 mg qhs for sleep.  Patient is in counseling once a week.  Recommend she stay off work for four more weeks. Two weeks at part-time starting April 1st and then going  full-time on April 15th.    Follow up  Return to clinic in 4 weeks.    Alline Ivans, PA-C

## 2023-07-17 ENCOUNTER — Ambulatory Visit (HOSPITAL_PSYCHIATRIC): Payer: Self-pay | Admitting: COUNSELOR-PROFESSIONAL

## 2023-07-24 ENCOUNTER — Ambulatory Visit (HOSPITAL_PSYCHIATRIC): Payer: Self-pay | Admitting: COUNSELOR-PROFESSIONAL

## 2023-07-31 ENCOUNTER — Ambulatory Visit (HOSPITAL_PSYCHIATRIC): Payer: Self-pay | Admitting: COUNSELOR-PROFESSIONAL

## 2023-08-01 ENCOUNTER — Ambulatory Visit: Payer: Self-pay | Attending: PHYSICIAN ASSISTANT | Admitting: PHYSICIAN ASSISTANT

## 2023-08-01 ENCOUNTER — Ambulatory Visit (HOSPITAL_PSYCHIATRIC): Payer: Self-pay | Admitting: COUNSELOR-PROFESSIONAL

## 2023-08-01 ENCOUNTER — Other Ambulatory Visit: Payer: Self-pay

## 2023-08-01 ENCOUNTER — Encounter (HOSPITAL_PSYCHIATRIC): Payer: Self-pay | Admitting: PHYSICIAN ASSISTANT

## 2023-08-01 VITALS — Resp 18 | Ht 65.0 in | Wt 290.0 lb

## 2023-08-01 DIAGNOSIS — F411 Generalized anxiety disorder: Secondary | ICD-10-CM | POA: Insufficient documentation

## 2023-08-01 DIAGNOSIS — F410 Panic disorder [episodic paroxysmal anxiety] without agoraphobia: Secondary | ICD-10-CM | POA: Insufficient documentation

## 2023-08-01 DIAGNOSIS — F4310 Post-traumatic stress disorder, unspecified: Secondary | ICD-10-CM | POA: Insufficient documentation

## 2023-08-01 DIAGNOSIS — Z634 Disappearance and death of family member: Secondary | ICD-10-CM

## 2023-08-01 DIAGNOSIS — F329 Major depressive disorder, single episode, unspecified: Secondary | ICD-10-CM | POA: Insufficient documentation

## 2023-08-01 MED ORDER — ARIPIPRAZOLE 2 MG TABLET
2.0000 mg | ORAL_TABLET | Freq: Every day | ORAL | 0 refills | Status: DC
Start: 2023-08-01 — End: 2023-09-01

## 2023-08-01 MED ORDER — CITALOPRAM 40 MG TABLET
40.0000 mg | ORAL_TABLET | Freq: Every day | ORAL | 1 refills | Status: DC
Start: 2023-08-01 — End: 2023-10-17

## 2023-08-01 MED ORDER — LORAZEPAM 1 MG TABLET
1.0000 mg | ORAL_TABLET | Freq: Three times a day (TID) | ORAL | 3 refills | Status: DC | PRN
Start: 2023-08-01 — End: 2023-09-01

## 2023-08-01 MED ORDER — ZOLPIDEM 10 MG TABLET
10.0000 mg | ORAL_TABLET | Freq: Every evening | ORAL | 3 refills | Status: DC
Start: 2023-08-01 — End: 2023-09-01

## 2023-08-01 NOTE — Progress Notes (Signed)
 BEHAVIORAL MEDICINE[facility] [place]  [address]  [place] [address]  Operated by [facility]  Progress Note    Name: Lindsey Daugherty MRN:  [id number]   Date: 08/01/2023 DOB:  Nov 29, 1975 (48 y.o.)           Chief Complaint: PTSD, Bereavement, Panic Disorder, MDD, and Generalized Anxiety    Subjective:   Patient reports that she found her boyfriend on Monday. They were trying to work on things. There is no cause of death. They are doing an autopsy. They were talking about coming to couple's counseling today. His family are working with her to set up the funeral arrangements. She is in counseling and has a supportive next-door neighbor. Her best friend, also his cousin that he as closet to, is in [place]  doing ministry at the moment, as she is a Optician, dispensing. She will be back tomorrow evening. Her church family are reaching out to her. She went to work on Monday, and will be going back today.  She will get five days for bereavement. The services are scheduled for next Friday. Her work let her do part-time for two weeks and starting on the 15th of the month she will go back to full-time. Her puppy is doing better, still limping because the meniscus is damaged. He has been released to regular activity. He is to go back in three months.   Mood "sad, angry"  Medications: Helping, but overwhelmed.   Appetite: She hasn't eaten in four days and has no appetite at all.   Sleep: She hasn't slept more than a couple of hours here and there.   Energy: Poor.   Stressors: Recent loss.    Patient Active Problem List   Diagnosis    Acquired hypothyroidism    Tobacco dependence due to cigarettes    Gastroesophageal reflux disease without esophagitis    Insomnia    Vitamin D deficiency    B12 deficiency    Morbid obesity with BMI of 45.0-49.9, adult (CMS HCC)    Hypertriglyceridemia    Environmental and seasonal allergies    Major depression    Generalized anxiety disorder with  panic attacks    Panic disorder without agoraphobia    Mild sleep apnea    Normal gynecologic examination    Nontoxic single thyroid nodule    Noninflammatory disorder of vagina    Non-toxic multinodular goiter    Need for immunization against influenza    Menorrhagia    Benign neoplasm of thyroid gland    Back pain    Chronic joint pain    Iron deficiency anemia    Abnormal findings on diagnostic imaging of other specified body structures    Weight loss advised    Encounter for weight management    PTSD (post-traumatic stress disorder)     Past Medical History:   Diagnosis Date    Acute vaginitis 08/23/2022    Bereavement 05/08/2022    Dermatitis, unspecified 08/23/2022    Dizziness and giddiness 08/23/2022    Dysmenorrhea 08/23/2022    Edema of lower extremity 07/24/2021    Esophageal reflux     Family history of cardiovascular disease 08/23/2022    Family history of malignant neoplasm of digestive organ 08/23/2022    Insomnia     Lightheadedness 08/23/2022    Migraine     Snoring 08/23/2022    Thyroid cancer (CMS HCC) 2018    Vaginal odor 08/23/2022     Past  Surgical History:   Procedure Laterality Date    HX CHOLECYSTECTOMY  02/20/2022    HX PARTIAL THYROIDECTOMY Right 2018    No chemo/radiation; dx thyroid cancer    HX PARTIAL THYROIDECTOMY Left 2020    no chemo/radiation    HX TONSILLECTOMY  1981     Family History   Problem Relation Name Age of Onset    No Known Problems Mother      Diabetes type II Father      Hypertension (High Blood Pressure) Father      Elevated Lipids Father      No Known Problems Sister      No Known Problems Brother      No Known Problems Half-Sister      No Known Problems Half-Brother      No Known Problems Maternal Aunt      No Known Problems Maternal Uncle      No Known Problems Paternal Aunt      No Known Problems Paternal Uncle      No Known Problems Maternal Grandmother      No Known Problems Maternal Grandfather      No Known Problems Paternal Grandmother      No Known Problems  Paternal Grandfather      No Known Problems Daughter      No Known Problems Son       Social History     Socioeconomic History    Marital status: Divorced    Number of children: 0    Highest education level: Associate degree: academic program   Occupational History    Occupation: Pharmaceutical   Tobacco Use    Smoking status: Every Day     Current packs/day: 0.50     Average packs/day: 0.5 packs/day for 30.3 years (15.1 ttl pk-yrs)     Types: Cigarettes     Start date: 1995    Smokeless tobacco: Never    Tobacco comments:     Declines cessation   Vaping Use    Vaping status: Never Used   Substance and Sexual Activity    Alcohol use: Never    Drug use: Never    Sexual activity: Not Currently     Partners: Male     Comment: LMP: 12/04/21 ON ORAL CONTRACEPTION; G2P0A2     Social Determinants of Health     Financial Resource Strain: Low Risk  (11/25/2022)    Financial Resource Strain     SDOH Financial: No   Transportation Needs: Low Risk  (11/25/2022)    Transportation Needs     SDOH Transportation: No   Social Connections: Low Risk  (11/25/2022)    Social Connections     SDOH Social Isolation: 5 or more times a week   Intimate Partner Violence: Low Risk  (11/25/2022)    Intimate Partner Violence     SDOH Domestic Violence: No   Housing Stability: Low Risk  (11/25/2022)    Housing Stability     SDOH Housing Situation: I have housing.     SDOH Housing Worry: No      Loratadine, Pseudoephedrine, Sulfamethoxazole-trimethoprim, Vitamin e, Vitamin e-safflower oil, and Vitamin e (d-alpha tocopherol)   Current Outpatient Medications   Medication Sig    ARIPiprazole (ABILIFY) 2 mg Oral Tablet Take 1 Tablet (2 mg total) by mouth Daily    ascorbic acid (SUPER C PO) Take 1 Tablet by mouth Once a day Super c with zinc    citalopram (CELEXA) 40 mg Oral Tablet  Take 1 Tablet (40 mg total) by mouth Daily for 180 days    drospirenone, contraceptive, (SLYND) 4 mg (28) Oral Tablet Take 1 Tablet (4 mg total) by mouth Daily    fluticasone  propionate (FLONASE) 50 mcg/actuation Nasal Spray, Suspension Administer 1 Spray into each nostril Once a day for 90 days    furosemide (LASIX) 20 mg Oral Tablet Take 1 Tablet (20 mg total) by mouth Once per day as needed    gabapentin (NEURONTIN) 300 mg Oral Capsule Take 1 Capsule (300 mg total) by mouth Daily    levothyroxine (SYNTHROID) 200 mcg Oral Tablet Take 1 Tablet (200 mcg total) by mouth Every morning for 90 days    LORazepam (ATIVAN) 1 mg Oral Tablet Take 1 Tablet (1 mg total) by mouth Three times a day as needed for Anxiety    lovastatin (MEVACOR) 20 mg Oral Tablet Take 1 Tablet (20 mg total) by mouth Every evening with dinner for 90 days    pantoprazole (PROTONIX) 40 mg Oral Tablet, Delayed Release (E.C.) Take 1 Tablet (40 mg total) by mouth Once a day for 90 days    prazosin (MINIPRESS) 1 mg Oral Capsule Take 1 Capsule (1 mg total) by mouth Every evening    prenatal 21/iron fu/folic acid (PRENATAL COMPLETE ORAL) Take 1 Tablet by mouth Once a day    valACYclovir (VALTREX) 500 mg Oral Tablet Take 1 Tablet (500 mg total) by mouth Daily    zolpidem (AMBIEN) 10 mg Oral Tablet Take 1 Tablet (10 mg total) by mouth Every night    ZYRTEC 10 mg Oral Tablet Take 1 Tablet (10 mg total) by mouth Once a day for 90 days      Objective :  Resp 18   Ht 1.651 m (5\' 5" )   Wt 132 kg (290 lb)   BMI 48.26 kg/m       PHQ Total Score  PHQ 2 Total: 6  PHQ 9 Total: 24  Interpretation of Total Score: 20-27 Severe depression             06/19/2023     8:15 AM 07/11/2023    10:56 AM 08/01/2023     9:43 AM   Most Recent PHQ-9 Scores   PHQ 9 Total 23 21 24         Mental Status Exam  AXOX4. Casual dress, calm, well-groomed. No SI, HI, AVH, delusions, or paranoia. Thoughts are logical, coherent, and goal-directed. Good eye contact. Speech is normal in rate and tone. Mood is "sad and angry" affect congruent. No psychomotor agitation or retardation, cogwheel rigidity, or abnormal movements. Gait is normal. Attention, concentration, and  memory are good. No cognitive deficits noted. Judgment and insight are fair. Calculation and abstraction are within normal limits.     Data Reviewed  I have reviewed patient's previous note medical, surgical, family, and social history in detail today,     Assessment  PTSD, Panic Disorder, MDD, and Generalized Anxiety    Plan  Continue the following medications:  Celexa 40 mg daily for mood.  Abilify 2 mg daily for mood.  Ativan 1 mg tid/prn for anxiety.  Ambien 10 mg qhs/prn for sleep.   Prazosin 1 mg qhs for sleep.  Patient is in counseling once a week.      Follow up  Return to clinic in 4 weeks.    Dawayne Cirri, PA-C

## 2023-08-01 NOTE — Patient Instructions (Signed)
 Take medications as prescribed, avoid drugs and alcohol, call office if symptoms worsen or problems arise.  [phone].

## 2023-08-06 ENCOUNTER — Other Ambulatory Visit: Payer: Self-pay

## 2023-08-07 ENCOUNTER — Ambulatory Visit: Payer: Self-pay | Attending: COUNSELOR-PROFESSIONAL | Admitting: COUNSELOR-PROFESSIONAL

## 2023-08-07 ENCOUNTER — Other Ambulatory Visit: Payer: Self-pay

## 2023-08-07 DIAGNOSIS — F411 Generalized anxiety disorder: Secondary | ICD-10-CM

## 2023-08-07 DIAGNOSIS — F410 Panic disorder [episodic paroxysmal anxiety] without agoraphobia: Secondary | ICD-10-CM

## 2023-08-07 DIAGNOSIS — Z634 Disappearance and death of family member: Secondary | ICD-10-CM

## 2023-08-07 DIAGNOSIS — F329 Major depressive disorder, single episode, unspecified: Secondary | ICD-10-CM

## 2023-08-07 DIAGNOSIS — F17210 Nicotine dependence, cigarettes, uncomplicated: Secondary | ICD-10-CM

## 2023-08-07 DIAGNOSIS — F4310 Post-traumatic stress disorder, unspecified: Secondary | ICD-10-CM

## 2023-08-07 NOTE — Progress Notes (Signed)
 BEHAVIORAL MEDICINE[facility] [place]  [address]  [place] [address]  Operated by [facility]    Name: Lindsey Daugherty MRN:  [id number]   Date: 08/07/2023 DOB:  05-25-1975 (48 y.o.)        Time In: 0900  Time Out: 1010     Diagnosis:   (F17.210) Tobacco dependence due to cigarettes  (primary encounter diagnosis)    (Z63.4) Bereavement    (F32.9) Major depression    (F41.1,  F41.0) Generalized anxiety disorder with panic attacks    (F41.0) Panic disorder without agoraphobia    (F43.10) PTSD (post-traumatic stress disorder)       Mental Status Examination:  Sensorium/Alertness: Alert  Orientation: Date , Person, Place, and Situation  Appearance: in street clothes appropriately groomed  Psychomotor Activity: Normal  Abnormal Behaviors: None  Attitude Towards Examiner: Attentive  Eye Contact: Normal  Speech: normal  Mood: appropriate to circumstances  Affect: Appropriate  Perception: normal  Though Process: linear/logical  Thought Content: Within Normal Limits  Impulse Control: Fair  Concentration/Calculation/Attention Span: within normal limits  How was the patient's Concentration/Calculation/Attention tested/assessed? Per observation and interview with patient   Recent Memory: grossly normal  Remote Memory: grossly normal  How was the patient's Remote Memory Tested/Assessed? Past Events, as it relates to history  Intelligence/Fund of Knowledge:  Average  How was the patient's Intelligence/Fund of Knowledge Tested/Assessed? based on history.  Judgement: Fair  How was the patient's Judgement Tested/Assessed? Per patient's behavior/history of present illness  Insight: Adequate  How was the patient's Insight Tested/Assessed? Understanding of severity of illness/history of present illness     Risk Assessment:     Based upon patient presentation and therapist observation during session, patient appears not to be SI/HI.  In the event of increase in SI or HI, I  recommended the patient use the following resources to reduce risk of harm to self or others.  Call 9-1-1  Go to the nearest emergency room  Call the National Suicide Prevention Lifeline (988)  Suicide Prevention Lifeline Online Chat - [employer].pt  Crisis Call: (209)021-1458 or (304) 325-HOPE    [x]  Client denies all areas of risk. No contrary clinical indications present.   Area of Risk:     Level of Risk:      Intent to Act:    Plan to Act:    Means to Act:  [x]  Low      []  Yes     []  Yes     []  Yes  []  Medium     []  No     []  No     []  No  []  High     [x]  Not Applicable   [x]  Not Applicable   [x]  Not Applicable  []  Imminent    Risk Factors:   Protective Factors:     Current Outpatient Medications   Medication Sig    ARIPiprazole (ABILIFY) 2 mg Oral Tablet Take 1 Tablet (2 mg total) by mouth Daily    ascorbic acid (SUPER C PO) Take 1 Tablet by mouth Once a day Super c with zinc    citalopram (CELEXA) 40 mg Oral Tablet Take 1 Tablet (40 mg total) by mouth Daily for 180 days    drospirenone, contraceptive, (SLYND) 4 mg (28) Oral Tablet Take 1 Tablet (4 mg total) by mouth Daily    fluticasone propionate (FLONASE) 50 mcg/actuation Nasal Spray, Suspension Administer 1 Spray into each nostril Once a day  for 90 days    furosemide (LASIX) 20 mg Oral Tablet Take 1 Tablet (20 mg total) by mouth Once per day as needed    gabapentin (NEURONTIN) 300 mg Oral Capsule Take 1 Capsule (300 mg total) by mouth Daily    levothyroxine (SYNTHROID) 200 mcg Oral Tablet Take 1 Tablet (200 mcg total) by mouth Every morning for 90 days    LORazepam (ATIVAN) 1 mg Oral Tablet Take 1 Tablet (1 mg total) by mouth Three times a day as needed for Anxiety    lovastatin (MEVACOR) 20 mg Oral Tablet Take 1 Tablet (20 mg total) by mouth Every evening with dinner for 90 days    pantoprazole (PROTONIX) 40 mg Oral Tablet, Delayed Release (E.C.) Take 1 Tablet (40 mg total) by mouth Once a day for 90 days    prazosin  (MINIPRESS) 1 mg Oral Capsule Take 1 Capsule (1 mg total) by mouth Every evening    prenatal 21/iron fu/folic acid (PRENATAL COMPLETE ORAL) Take 1 Tablet by mouth Once a day    valACYclovir (VALTREX) 500 mg Oral Tablet Take 1 Tablet (500 mg total) by mouth Daily    zolpidem (AMBIEN) 10 mg Oral Tablet Take 1 Tablet (10 mg total) by mouth Every night    ZYRTEC 10 mg Oral Tablet Take 1 Tablet (10 mg total) by mouth Once a day for 90 days        Symptom Description/Subjective Report: Lindsey Daugherty presents today for follow-up of MDD. GAD, Panic, PTSD, Bereavement; ct has not been taking her medications consistently, has had crying episodes, not sleeping well, not eating well, she found her boyfriend deceased in his home on Apr 17, 2025and has been reeling from this loss while she is still grieving her parents loss. She feels "numb" and in "in shock" and is worried about the funeral tomorrow and how she will handle things.     Objective Content: Clinician completed a check-in with the ct regarding her emotions and what has happened last week. Clinician aided ct in exploring her emotions and hopelessness related to the loss and encouraged her to set alarms for her medication for memory, to take care of herself so that she is better able to handle things. Clinician encouraged ct not to isolate and will increase to weekly sessions as she continues to process her loss.    Interventions Used:   Motivational Interviewing      Goal Progress: In Progress / Ongoing    Recommendation: Continue current therapeutic focus.    Plan: Lindsey Daugherty will benefit from regular psychotherapy. Patient will return to this clinic in 1 week(s) for follow-up.    Stephannie Ehlers, Licensed Professional Counselor  08/07/2023, 09:00

## 2023-08-13 ENCOUNTER — Ambulatory Visit (RURAL_HEALTH_CENTER): Attending: Family | Admitting: Family

## 2023-08-13 ENCOUNTER — Ambulatory Visit: Attending: Family | Admitting: Family

## 2023-08-13 ENCOUNTER — Encounter (RURAL_HEALTH_CENTER): Payer: Self-pay | Admitting: Family

## 2023-08-13 ENCOUNTER — Other Ambulatory Visit: Payer: Self-pay

## 2023-08-13 VITALS — BP 148/84 | HR 77 | Temp 97.8°F | Wt 288.0 lb

## 2023-08-13 DIAGNOSIS — E039 Hypothyroidism, unspecified: Secondary | ICD-10-CM | POA: Insufficient documentation

## 2023-08-13 DIAGNOSIS — F410 Panic disorder [episodic paroxysmal anxiety] without agoraphobia: Secondary | ICD-10-CM | POA: Insufficient documentation

## 2023-08-13 DIAGNOSIS — R0989 Other specified symptoms and signs involving the circulatory and respiratory systems: Secondary | ICD-10-CM | POA: Insufficient documentation

## 2023-08-13 DIAGNOSIS — Z6841 Body Mass Index (BMI) 40.0 and over, adult: Secondary | ICD-10-CM | POA: Insufficient documentation

## 2023-08-13 DIAGNOSIS — J3089 Other allergic rhinitis: Secondary | ICD-10-CM | POA: Insufficient documentation

## 2023-08-13 DIAGNOSIS — Z634 Disappearance and death of family member: Secondary | ICD-10-CM | POA: Insufficient documentation

## 2023-08-13 DIAGNOSIS — E669 Obesity, unspecified: Secondary | ICD-10-CM | POA: Insufficient documentation

## 2023-08-13 DIAGNOSIS — F411 Generalized anxiety disorder: Secondary | ICD-10-CM | POA: Insufficient documentation

## 2023-08-13 LAB — CBC
HCT: 42.9 % — ABNORMAL HIGH (ref 31.2–41.9)
HGB: 14.9 g/dL — ABNORMAL HIGH (ref 10.9–14.3)
MCH: 30.3 pg (ref 24.7–32.8)
MCHC: 34.8 g/dL (ref 32.3–35.6)
MCV: 87 fL (ref 75.5–95.3)
MPV: 10.6 fL (ref 7.9–10.8)
PLATELETS: 201 10*3/uL (ref 140–440)
RBC: 4.93 10*6/uL — ABNORMAL HIGH (ref 3.63–4.92)
RDW: 12.6 % (ref 12.3–17.7)
WBC: 9 10*3/uL (ref 3.8–11.8)

## 2023-08-13 LAB — COMPREHENSIVE METABOLIC PANEL, NON-FASTING
ALBUMIN/GLOBULIN RATIO: 1.3 (ref 0.8–1.4)
ALBUMIN: 4 g/dL (ref 3.5–5.7)
ALKALINE PHOSPHATASE: 40 U/L (ref 34–104)
ALT (SGPT): 11 U/L (ref 7–52)
ANION GAP: 7 mmol/L (ref 4–13)
AST (SGOT): 15 U/L (ref 13–39)
BILIRUBIN TOTAL: 0.3 mg/dL (ref 0.3–1.0)
BUN/CREA RATIO: 18 (ref 6–22)
BUN: 15 mg/dL (ref 7–25)
CALCIUM, CORRECTED: 9.2 mg/dL (ref 8.9–10.8)
CALCIUM: 9.2 mg/dL (ref 8.6–10.3)
CHLORIDE: 106 mmol/L (ref 98–107)
CO2 TOTAL: 25 mmol/L (ref 21–31)
CREATININE: 0.84 mg/dL (ref 0.60–1.30)
ESTIMATED GFR: 85 mL/min/{1.73_m2} (ref >59)
GLOBULIN: 3.2 (ref 2.0–3.5)
GLUCOSE: 87 mg/dL (ref 74–109)
OSMOLALITY, CALCULATED: 276 mosm/kg (ref 270–290)
POTASSIUM: 3.8 mmol/L (ref 3.5–5.1)
PROTEIN TOTAL: 7.2 g/dL (ref 6.4–8.9)
SODIUM: 138 mmol/L (ref 136–145)

## 2023-08-13 LAB — POCT RAPID FLU
INFLUENZA TYPE A: NEGATIVE
INFLUENZA TYPE B: NEGATIVE

## 2023-08-13 LAB — POCT RAPID COVID (SOFIA) (AMB ONLY): COVID-19 AG: NEGATIVE

## 2023-08-13 LAB — THYROID STIMULATING HORMONE WITH FREE T4 REFLEX: TSH: 31.037 u[IU]/mL — ABNORMAL HIGH (ref 0.450–5.330)

## 2023-08-13 LAB — THYROXINE, FREE (FREE T4): THYROXINE (T4), FREE: 0.68 ng/dL (ref 0.61–1.12)

## 2023-08-13 LAB — POCT RAPID STREP A: RAPID STREP A (POCT): NEGATIVE

## 2023-08-13 MED ORDER — LEVOTHYROXINE 200 MCG TABLET
200.0000 ug | ORAL_TABLET | Freq: Every morning | ORAL | 1 refills | Status: AC
Start: 2023-08-13 — End: ?

## 2023-08-13 NOTE — Assessment & Plan Note (Signed)
 Further treatment pending labs.  Continue levothyroxine  200 mcg daily.  Patient reports missed doses x3 weeks.  Advised to take levothyroxine  in the morning on an empty stomach and wait 30 minutes prior to eating/drinking.  Discussed the importance of medication compliance.  Patient verbalized understanding.  Recheck TSH in 4 weeks.

## 2023-08-13 NOTE — Assessment & Plan Note (Signed)
 Advised to follow up with Arville Bis, psychiatrist as scheduled.

## 2023-08-13 NOTE — Progress Notes (Signed)
 [facility] MEDICINE [facility]  [employer] FAMILY MEDICINE      [name]  MRN: [id number]  DOB: [birth year]  Date of Service: 08/13/2023    CHIEF COMPLAINT  Chief Complaint   Patient presents with    Lab Results    Dizzy     shakey       SUBJECTIVE  Lindsey Daugherty is a 47 y.o. female who presents to clinic for recheck a thyroid .  Patient reports she has been off her thyroid  medication for 3 weeks.  Patient reports she has not been eating and drinking well.      Patient states she found her boyfriend deceased on 2023/08/15.  Patient reports she has been overwhelmed with grief and is under the care of psychology and psychiatrist at the [facility].  Denies SI/HI/AVH.  Patient reports she is following up with counseling and taking psychiatric medications as prescribed.    Patient reports at the funeral she became very lightheaded and dizzy and has been having a runny nose.  Denies known exposure to sick contacts.  Denies fever, cough, nausea, vomiting or diarrhea.  Denies chest pain, palpitations or shortness for breath.  Denies recent hospitalizations or surgeries.    Review of Systems:  Positive ROS discussed in HPI, otherwise all other systems negative.      Medications:   ARIPiprazole  (ABILIFY ) 2 mg Oral Tablet, Take 1 Tablet (2 mg total) by mouth Daily  ascorbic acid (SUPER C PO), Take 1 Tablet by mouth Once a day Super c with zinc  citalopram  (CELEXA ) 40 mg Oral Tablet, Take 1 Tablet (40 mg total) by mouth Daily for 180 days  drospirenone, contraceptive, (SLYND) 4 mg (28) Oral Tablet, Take 1 Tablet (4 mg total) by mouth Daily  fluticasone  propionate (FLONASE ) 50 mcg/actuation Nasal Spray, Suspension, Administer 1 Spray into each nostril Once a day for 90 days  furosemide (LASIX) 20 mg Oral Tablet, Take 1 Tablet (20 mg total) by mouth Once per day as needed  gabapentin (NEURONTIN) 300 mg Oral Capsule, Take 1 Capsule (300 mg total) by mouth Daily  LORazepam  (ATIVAN ) 1 mg Oral Tablet,  Take 1 Tablet (1 mg total) by mouth Three times a day as needed for Anxiety  lovastatin  (MEVACOR ) 20 mg Oral Tablet, Take 1 Tablet (20 mg total) by mouth Every evening with dinner for 90 days  pantoprazole  (PROTONIX ) 40 mg Oral Tablet, Delayed Release (E.C.), Take 1 Tablet (40 mg total) by mouth Once a day for 90 days  prazosin  (MINIPRESS ) 1 mg Oral Capsule, Take 1 Capsule (1 mg total) by mouth Every evening  prenatal 21/iron fu/folic acid (PRENATAL COMPLETE ORAL), Take 1 Tablet by mouth Once a day  valACYclovir (VALTREX) 500 mg Oral Tablet, Take 1 Tablet (500 mg total) by mouth Daily  zolpidem  (AMBIEN ) 10 mg Oral Tablet, Take 1 Tablet (10 mg total) by mouth Every night  ZYRTEC  10 mg Oral Tablet, Take 1 Tablet (10 mg total) by mouth Once a day for 90 days  levothyroxine  (SYNTHROID) 200 mcg Oral Tablet, Take 1 Tablet (200 mcg total) by mouth Every morning for 90 days    No facility-administered medications prior to visit.      Allergies:   Allergies   Allergen Reactions    Loratadine Shortness of Breath and  Other Adverse Reaction (Add comment)     claritan D    Pseudoephedrine Shortness of Breath    Sulfamethoxazole-Trimethoprim Itching and Hives/ Urticaria    Vitamin E Rash  Vitamin E-Safflower Oil     Vitamin E (D-Alpha Tocopherol) Itching         OBJECTIVE  BP (!) 148/84 (Site: Right Arm, Patient Position: Sitting, Cuff Size: Adult)   Pulse 77   Temp 36.6 C (97.8 F)   Wt 131 kg (288 lb)   SpO2 98%   BMI 47.93 kg/m       Physical Exam  Vitals reviewed.   Constitutional:       Appearance: She is obese.   Cardiovascular:      Rate and Rhythm: Normal rate and regular rhythm.      Pulses: Normal pulses.      Heart sounds: Normal heart sounds.   Pulmonary:      Effort: Pulmonary effort is normal.      Breath sounds: Normal breath sounds.   Musculoskeletal:      Cervical back: Neck supple.   Neurological:      General: No focal deficit present.      Mental Status: She is alert and oriented to person, place,  and time.      Motor: Motor function is intact.      Coordination: Coordination is intact.      Gait: Gait is intact.   Psychiatric:         Mood and Affect: Mood is anxious.         Behavior: Behavior normal. Behavior is cooperative.         Cognition and Memory: Cognition normal.           ASSESSMENT/PLAN  (E03.9) Acquired hypothyroidism  (primary encounter diagnosis)  Plan: CBC, COMPREHENSIVE METABOLIC PANEL,         NON-FASTING, THYROID  STIMULATING HORMONE WITH         FREE T4 REFLEX, THYROXINE, FREE (FREE T4)    (F41.1,  F41.0) Generalized anxiety disorder with panic attacks  Plan: CBC, COMPREHENSIVE METABOLIC PANEL,         NON-FASTING, THYROID  STIMULATING HORMONE WITH         FREE T4 REFLEX, THYROXINE, FREE (FREE T4)    (Z63.4) Bereavement    (J30.89) Environmental and seasonal allergies    (R09.89) Runny nose  Plan: POCT Rapid Covid Coye Diver), POCT Rapid Flu, POCT         Rapid Strep       Problem List Items Addressed This Visit          Endocrine    Acquired hypothyroidism - Primary    Further treatment pending labs.  Continue levothyroxine  200 mcg daily.  Patient reports missed doses x3 weeks.  Advised to take levothyroxine  in the morning on an empty stomach and wait 30 minutes prior to eating/drinking.  Discussed the importance of medication compliance.  Patient verbalized understanding.  Recheck TSH in 4 weeks.         Relevant Orders    CBC (Completed)    COMPREHENSIVE METABOLIC PANEL, NON-FASTING (Completed)    THYROID  STIMULATING HORMONE WITH FREE T4 REFLEX (Completed)    THYROXINE, FREE (FREE T4) (Completed)       Psychiatric    Bereavement    Patient denies SI/HI/AVH.  Advised to follow up with Stephannie Ehlers, counselor as scheduled.         Generalized anxiety disorder with panic attacks    Advised to follow up with Arville Bis, psychiatrist as scheduled.         Relevant Orders    CBC (Completed)    COMPREHENSIVE METABOLIC PANEL, NON-FASTING (Completed)  THYROID  STIMULATING HORMONE WITH FREE T4  REFLEX (Completed)    THYROXINE, FREE (FREE T4) (Completed)       Other    Environmental and seasonal allergies    Advised to take an over-the-counter daily antihistamine.  Start fluticasone  nasal spray 1 spray each nostril daily.  Strongly advised smoking cessation.  Hydrate well.    Strep screening is negative.  Flu screening is negative.  COVID screening is negative.          Other Visit Diagnoses         Runny nose        Relevant Orders    POCT Rapid Covid (Sofia) (Completed)    POCT Rapid Flu (Completed)    POCT Rapid Strep (Completed)           Orders Placed This Encounter    CBC    COMPREHENSIVE METABOLIC PANEL, NON-FASTING    THYROID  STIMULATING HORMONE WITH FREE T4 REFLEX    THYROXINE, FREE (FREE T4)    POCT Rapid Covid Coye Diver)    POCT Rapid Flu    POCT Rapid Strep    levothyroxine  (SYNTHROID) 200 mcg Oral Tablet        Return in about 1 week (around 08/20/2023) for discuss labs/thyroid .    Valorie Gearing, NP-C 08/13/2023, 18:07

## 2023-08-13 NOTE — Assessment & Plan Note (Signed)
 Advised to take an over-the-counter daily antihistamine.  Start fluticasone  nasal spray 1 spray each nostril daily.  Strongly advised smoking cessation.  Hydrate well.    Strep screening is negative.  Flu screening is negative.  COVID screening is negative.

## 2023-08-13 NOTE — Nursing Note (Signed)
 Patient here complaining of dizzy, shaky, anxiety attacks, and not been taking thyroid  med

## 2023-08-13 NOTE — Assessment & Plan Note (Signed)
 Patient denies SI/HI/AVH.  Advised to follow up with Stephannie Ehlers, counselor as scheduled.

## 2023-08-14 ENCOUNTER — Ambulatory Visit (HOSPITAL_PSYCHIATRIC): Payer: Self-pay | Admitting: COUNSELOR-PROFESSIONAL

## 2023-08-18 ENCOUNTER — Ambulatory Visit (HOSPITAL_PSYCHIATRIC): Payer: Self-pay | Admitting: COUNSELOR-PROFESSIONAL

## 2023-08-21 ENCOUNTER — Encounter (RURAL_HEALTH_CENTER): Payer: Self-pay

## 2023-08-21 ENCOUNTER — Ambulatory Visit (RURAL_HEALTH_CENTER): Payer: Self-pay | Admitting: Family

## 2023-08-21 NOTE — Telephone Encounter (Signed)
 Patient informed and voiced understanding

## 2023-08-25 ENCOUNTER — Ambulatory Visit (HOSPITAL_PSYCHIATRIC): Payer: Self-pay | Admitting: COUNSELOR-PROFESSIONAL

## 2023-08-25 ENCOUNTER — Ambulatory Visit (RURAL_HEALTH_CENTER): Payer: Self-pay | Admitting: Family

## 2023-08-26 ENCOUNTER — Ambulatory Visit (RURAL_HEALTH_CENTER): Payer: Self-pay | Admitting: Family

## 2023-09-01 ENCOUNTER — Ambulatory Visit (HOSPITAL_PSYCHIATRIC): Payer: Self-pay | Admitting: PHYSICIAN ASSISTANT

## 2023-09-01 ENCOUNTER — Other Ambulatory Visit: Payer: Self-pay

## 2023-09-01 ENCOUNTER — Ambulatory Visit: Payer: Self-pay | Attending: PHYSICIAN ASSISTANT | Admitting: PHYSICIAN ASSISTANT

## 2023-09-01 ENCOUNTER — Encounter (HOSPITAL_PSYCHIATRIC): Payer: Self-pay | Admitting: PHYSICIAN ASSISTANT

## 2023-09-01 VITALS — BP 125/83 | HR 84 | Resp 18 | Ht 65.0 in | Wt 283.0 lb

## 2023-09-01 DIAGNOSIS — F4310 Post-traumatic stress disorder, unspecified: Secondary | ICD-10-CM | POA: Insufficient documentation

## 2023-09-01 DIAGNOSIS — Z79899 Other long term (current) drug therapy: Secondary | ICD-10-CM

## 2023-09-01 DIAGNOSIS — Z634 Disappearance and death of family member: Secondary | ICD-10-CM | POA: Insufficient documentation

## 2023-09-01 DIAGNOSIS — F411 Generalized anxiety disorder: Secondary | ICD-10-CM | POA: Insufficient documentation

## 2023-09-01 DIAGNOSIS — F5104 Psychophysiologic insomnia: Secondary | ICD-10-CM | POA: Insufficient documentation

## 2023-09-01 DIAGNOSIS — F329 Major depressive disorder, single episode, unspecified: Secondary | ICD-10-CM | POA: Insufficient documentation

## 2023-09-01 DIAGNOSIS — F410 Panic disorder [episodic paroxysmal anxiety] without agoraphobia: Secondary | ICD-10-CM | POA: Insufficient documentation

## 2023-09-01 MED ORDER — ARIPIPRAZOLE 5 MG TABLET
5.0000 mg | ORAL_TABLET | Freq: Every day | ORAL | 1 refills | Status: DC
Start: 2023-09-01 — End: 2023-10-17

## 2023-09-01 MED ORDER — LORAZEPAM 1 MG TABLET
1.0000 mg | ORAL_TABLET | Freq: Three times a day (TID) | ORAL | 1 refills | Status: DC
Start: 2023-09-01 — End: 2023-10-17

## 2023-09-01 MED ORDER — ZOLPIDEM 10 MG TABLET
10.0000 mg | ORAL_TABLET | Freq: Every evening | ORAL | 1 refills | Status: DC
Start: 2023-09-01 — End: 2023-10-17

## 2023-09-01 MED ORDER — PRAZOSIN 1 MG CAPSULE
1.0000 mg | ORAL_CAPSULE | Freq: Every evening | ORAL | 1 refills | Status: DC
Start: 2023-09-01 — End: 2023-10-17

## 2023-09-01 NOTE — Progress Notes (Signed)
 BEHAVIORAL MEDICINE, [facility]  [address]  [place] [address]  Operated by [facility]  Progress Note    Name: Lindsey Daugherty MRN:  [id number]   Date: 09/01/2023 DOB:  [birth year] (48 y.o.)           Chief Complaint: Major Depression, GAD, Bereavement, Insomnia, Panic Disorder, and PTSD    Subjective:   Patient reports that she has been taking it one day at time. The funeral and services went well and everything was pretty. Now that things have settled down, it has been worse. She came in the day before the funeral, she met with her therapist, and she started feeling dizzy. She felt like she was on the outside looking in and felt real anxious. She left the session, went home, and sat in the car. She text her next-door neighbor to tell her that she didn't feel right. She felt so tired. Her neighbor took her blood pressure and did a finger-stick, both were WNL. She started shaking, they called the ambulance and the paramedics came in and stayed with her for about forty minutes.. It turns out that she was having a severe panic attack and did not need to go to the ED. She went to see her PCP and her TSH was through the roof, she had been forgetting to take her synthroid. She goes back for a re-check on the 15th, close to four weeks of her taking her medications and eating. She just had to take him to the vet on Thursday, he was peeing blood and they found evidence of an UTI. Still limping, but the vet said it probably will never go away.  Mood: "just here".   Medications: Still depressed, but feels that they are helpful.   Appetite: Decreased, but forcing herself to eat at least once daily.   Sleep:: She goes to sleep well, but then wakes up throughout the night.  IT takes her time to get back to sleep. She denies nightmares.   Energy: Decreased with amotivation and anhedonia. She feels that she could take a nap at any time during the day.   Stressors:  Recent loss, work-no motivation.    Patient Active Problem List   Diagnosis    Acquired hypothyroidism    Tobacco dependence due to cigarettes    Gastroesophageal reflux disease without esophagitis    Insomnia    Vitamin D  deficiency    B12 deficiency    Morbid obesity with BMI of 45.0-49.9, adult (CMS HCC)    Hypertriglyceridemia    Bereavement    Environmental and seasonal allergies    Major depression    Generalized anxiety disorder with panic attacks    Panic disorder without agoraphobia    Mild sleep apnea    Normal gynecologic examination    Nontoxic single thyroid  nodule    Noninflammatory disorder of vagina    Non-toxic multinodular goiter    Need for immunization against influenza    Menorrhagia    Benign neoplasm of thyroid  gland    Back pain    Chronic joint pain    Iron deficiency anemia    Abnormal findings on diagnostic imaging of other specified body structures    Weight loss advised    Encounter for weight management    PTSD (post-traumatic stress disorder)     Past Medical History:   Diagnosis Date    Acute vaginitis 08/23/2022    Bereavement 05/08/2022    Dermatitis,  unspecified 08/23/2022    Dizziness and giddiness 08/23/2022    Dysmenorrhea 08/23/2022    Edema of lower extremity 07/24/2021    Esophageal reflux     Family history of cardiovascular disease 08/23/2022    Family history of malignant neoplasm of digestive organ 08/23/2022    Insomnia     Lightheadedness 08/23/2022    Migraine     Snoring 08/23/2022    Thyroid  cancer (CMS HCC) 2018    Vaginal odor 08/23/2022     Past Surgical History:   Procedure Laterality Date    HX CHOLECYSTECTOMY  02/20/2022    HX PARTIAL THYROIDECTOMY Right 2018    No chemo/radiation; dx thyroid  cancer    HX PARTIAL THYROIDECTOMY Left 2020    no chemo/radiation    HX TONSILLECTOMY  1981     Family History   Problem Relation Name Age of Onset    No Known Problems Mother      Diabetes type II Father      Hypertension (High Blood Pressure) Father      Elevated Lipids  Father      No Known Problems Sister      No Known Problems Brother      No Known Problems Half-Sister      No Known Problems Half-Brother      No Known Problems Maternal Aunt      No Known Problems Maternal Uncle      No Known Problems Paternal Aunt      No Known Problems Paternal Uncle      No Known Problems Maternal Grandmother      No Known Problems Maternal Grandfather      No Known Problems Paternal Grandmother      No Known Problems Paternal Grandfather      No Known Problems Daughter      No Known Problems Son       Social History     Socioeconomic History    Marital status: Divorced    Number of children: 0    Highest education level: Associate degree: academic program   Occupational History    Occupation: Pharmaceutical   Tobacco Use    Smoking status: Every Day     Current packs/day: 0.50     Average packs/day: 0.5 packs/day for 30.4 years (15.2 ttl pk-yrs)     Types: Cigarettes     Start date: 1995    Smokeless tobacco: Never    Tobacco comments:     Declines cessation   Vaping Use    Vaping status: Never Used   Substance and Sexual Activity    Alcohol use: Never    Drug use: Never    Sexual activity: Not Currently     Partners: Male     Comment: LMP: 12/04/21 ON ORAL CONTRACEPTION; G2P0A2     Social Determinants of Health     Financial Resource Strain: Low Risk  (11/25/2022)    Financial Resource Strain     SDOH Financial: No   Transportation Needs: Low Risk  (11/25/2022)    Transportation Needs     SDOH Transportation: No   Social Connections: Low Risk  (11/25/2022)    Social Connections     SDOH Social Isolation: 5 or more times a week   Intimate Partner Violence: Low Risk  (11/25/2022)    Intimate Partner Violence     SDOH Domestic Violence: No   Housing Stability: Low Risk  (11/25/2022)    Housing Stability  SDOH Housing Situation: I have housing.     SDOH Housing Worry: No      Loratadine, Pseudoephedrine, Sulfamethoxazole-trimethoprim, Vitamin e, Vitamin e-safflower oil, and Vitamin e (d-alpha  tocopherol)   Current Outpatient Medications   Medication Sig    ARIPiprazole  (ABILIFY ) 2 mg Oral Tablet Take 1 Tablet (2 mg total) by mouth Daily    ascorbic acid (SUPER C PO) Take 1 Tablet by mouth Once a day Super c with zinc    citalopram  (CELEXA ) 40 mg Oral Tablet Take 1 Tablet (40 mg total) by mouth Daily for 180 days    drospirenone, contraceptive, (SLYND) 4 mg (28) Oral Tablet Take 1 Tablet (4 mg total) by mouth Daily    fluticasone  propionate (FLONASE ) 50 mcg/actuation Nasal Spray, Suspension Administer 1 Spray into each nostril Once a day for 90 days    furosemide (LASIX) 20 mg Oral Tablet Take 1 Tablet (20 mg total) by mouth Once per day as needed    gabapentin (NEURONTIN) 300 mg Oral Capsule Take 1 Capsule (300 mg total) by mouth Daily    levothyroxine  (SYNTHROID) 200 mcg Oral Tablet Take 1 Tablet (200 mcg total) by mouth Every morning for 90 days    LORazepam  (ATIVAN ) 1 mg Oral Tablet Take 1 Tablet (1 mg total) by mouth Three times a day as needed for Anxiety    lovastatin  (MEVACOR ) 20 mg Oral Tablet Take 1 Tablet (20 mg total) by mouth Every evening with dinner for 90 days    pantoprazole  (PROTONIX ) 40 mg Oral Tablet, Delayed Release (E.C.) Take 1 Tablet (40 mg total) by mouth Once a day for 90 days    prazosin  (MINIPRESS ) 1 mg Oral Capsule Take 1 Capsule (1 mg total) by mouth Every evening    prenatal 21/iron fu/folic acid (PRENATAL COMPLETE ORAL) Take 1 Tablet by mouth Once a day    valACYclovir (VALTREX) 500 mg Oral Tablet Take 1 Tablet (500 mg total) by mouth Daily    zolpidem  (AMBIEN ) 10 mg Oral Tablet Take 1 Tablet (10 mg total) by mouth Every night    ZYRTEC  10 mg Oral Tablet Take 1 Tablet (10 mg total) by mouth Once a day for 90 days      Objective :  BP 125/83 (Site: Left Arm, Patient Position: Sitting)   Pulse 84   Resp 18   Ht 1.651 m (5\' 5" )   Wt 128 kg (283 lb)   BMI 47.09 kg/m       PHQ Total Score  PHQ 2 Total: 6  PHQ 9 Total: 24  Interpretation of Total Score: 20-27 Severe  depression             07/11/2023    10:56 AM 08/01/2023     9:43 AM 09/01/2023     8:45 AM   Most Recent PHQ-9 Scores   PHQ 9 Total 21 24 24         Mental Status Exam  AXOX4. Casual dress, calm, well-groomed. No SI, HI, AVH, delusions, or paranoia. Thoughts are logical, coherent, and goal-directed. Good eye contact. Speech is normal in rate and tone. Mood is "just here" affect congruent. No psychomotor agitation or retardation, cogwheel rigidity, or abnormal movements. Gait is normal. Attention, concentration, and memory are good. No cognitive deficits noted. Judgment and insight are fair. Calculation and abstraction are within normal limits.     Data Reviewed  I have reviewed patient's previous note medical, surgical, family, and social history in detail today,  Assessment  Major Depression, GAD, Bereavement, Panic Disorder, and PTSD    Plan  Continue the following medications:  Celexa  40 mg daily for mood.  Increase Abilify  from 2 to 5 mg daily for mood.  Ativan  1 mg tid for anxiety.  Ambien  10 mg qhs/prn for sleep.   Prazosin  1 mg qhs for sleep.  Patient has cancelled every appointment with her therapist since the day of the panic attack.       Follow up  Return to clinic in 4 weeks.    Alline Ivans, PA-C

## 2023-09-01 NOTE — Patient Instructions (Addendum)
 Take medications as prescribed, avoid drugs and alcohol, call office if symptoms worsen or problems arise.  [phone].     Increase Abilify  from 2 mg to 5 mg every morning for mood.  Continue Celexa , Prazosin , and Ambien .  Take Ativan  1 mg every 8 hours.

## 2023-09-04 ENCOUNTER — Encounter (RURAL_HEALTH_CENTER): Payer: Self-pay | Admitting: Family

## 2023-09-04 ENCOUNTER — Ambulatory Visit: Attending: Family | Admitting: Family

## 2023-09-04 ENCOUNTER — Other Ambulatory Visit: Payer: Self-pay

## 2023-09-04 ENCOUNTER — Ambulatory Visit (RURAL_HEALTH_CENTER): Payer: Self-pay | Attending: Family | Admitting: Family

## 2023-09-04 VITALS — BP 133/80 | HR 71 | Temp 98.0°F | Wt 284.0 lb

## 2023-09-04 DIAGNOSIS — E559 Vitamin D deficiency, unspecified: Secondary | ICD-10-CM | POA: Insufficient documentation

## 2023-09-04 DIAGNOSIS — J3089 Other allergic rhinitis: Secondary | ICD-10-CM | POA: Insufficient documentation

## 2023-09-04 DIAGNOSIS — G4700 Insomnia, unspecified: Secondary | ICD-10-CM | POA: Insufficient documentation

## 2023-09-04 DIAGNOSIS — Z79890 Hormone replacement therapy (postmenopausal): Secondary | ICD-10-CM | POA: Insufficient documentation

## 2023-09-04 DIAGNOSIS — E781 Pure hyperglyceridemia: Secondary | ICD-10-CM | POA: Insufficient documentation

## 2023-09-04 DIAGNOSIS — Z6841 Body Mass Index (BMI) 40.0 and over, adult: Secondary | ICD-10-CM | POA: Insufficient documentation

## 2023-09-04 DIAGNOSIS — E039 Hypothyroidism, unspecified: Secondary | ICD-10-CM | POA: Insufficient documentation

## 2023-09-04 DIAGNOSIS — F411 Generalized anxiety disorder: Secondary | ICD-10-CM | POA: Insufficient documentation

## 2023-09-04 DIAGNOSIS — Z79899 Other long term (current) drug therapy: Secondary | ICD-10-CM | POA: Insufficient documentation

## 2023-09-04 DIAGNOSIS — Z7689 Persons encountering health services in other specified circumstances: Secondary | ICD-10-CM | POA: Insufficient documentation

## 2023-09-04 DIAGNOSIS — F17210 Nicotine dependence, cigarettes, uncomplicated: Secondary | ICD-10-CM | POA: Insufficient documentation

## 2023-09-04 DIAGNOSIS — K219 Gastro-esophageal reflux disease without esophagitis: Secondary | ICD-10-CM | POA: Insufficient documentation

## 2023-09-04 DIAGNOSIS — F410 Panic disorder [episodic paroxysmal anxiety] without agoraphobia: Secondary | ICD-10-CM | POA: Insufficient documentation

## 2023-09-04 DIAGNOSIS — D509 Iron deficiency anemia, unspecified: Secondary | ICD-10-CM | POA: Insufficient documentation

## 2023-09-04 DIAGNOSIS — J302 Other seasonal allergic rhinitis: Secondary | ICD-10-CM | POA: Insufficient documentation

## 2023-09-04 DIAGNOSIS — E538 Deficiency of other specified B group vitamins: Secondary | ICD-10-CM | POA: Insufficient documentation

## 2023-09-04 DIAGNOSIS — G4730 Sleep apnea, unspecified: Secondary | ICD-10-CM | POA: Insufficient documentation

## 2023-09-04 LAB — COMPREHENSIVE METABOLIC PNL, FASTING
ALBUMIN/GLOBULIN RATIO: 1.2 (ref 0.8–1.4)
ALBUMIN: 4.1 g/dL (ref 3.5–5.7)
ALKALINE PHOSPHATASE: 46 U/L (ref 34–104)
ALT (SGPT): 12 U/L (ref 7–52)
ANION GAP: 8 mmol/L (ref 4–13)
AST (SGOT): 16 U/L (ref 13–39)
BILIRUBIN TOTAL: 0.4 mg/dL (ref 0.3–1.0)
BUN/CREA RATIO: 11 (ref 6–22)
BUN: 9 mg/dL (ref 7–25)
CALCIUM, CORRECTED: 9.1 mg/dL (ref 8.9–10.8)
CALCIUM: 9.2 mg/dL (ref 8.6–10.3)
CHLORIDE: 105 mmol/L (ref 98–107)
CO2 TOTAL: 25 mmol/L (ref 21–31)
CREATININE: 0.84 mg/dL (ref 0.60–1.30)
ESTIMATED GFR: 85 mL/min/{1.73_m2} (ref >59)
GLOBULIN: 3.3 (ref 2.0–3.5)
GLUCOSE: 101 mg/dL (ref 74–109)
OSMOLALITY, CALCULATED: 275 mosm/kg (ref 270–290)
POTASSIUM: 4 mmol/L (ref 3.5–5.1)
PROTEIN TOTAL: 7.4 g/dL (ref 6.4–8.9)
SODIUM: 138 mmol/L (ref 136–145)

## 2023-09-04 LAB — LIPID PANEL
CHOL/HDL RATIO: 5.1
CHOLESTEROL: 153 mg/dL (ref <200)
HDL CHOL: 30 mg/dL — ABNORMAL LOW (ref >=40)
LDL CALC: 87 mg/dL (ref 0–100)
TRIGLYCERIDES: 180 mg/dL — ABNORMAL HIGH (ref <=150)
VLDL CALC: 36 mg/dL (ref 0–50)

## 2023-09-04 LAB — CBC
HCT: 43.2 % — ABNORMAL HIGH (ref 31.2–41.9)
HGB: 15.2 g/dL — ABNORMAL HIGH (ref 10.9–14.3)
MCH: 30.4 pg (ref 24.7–32.8)
MCHC: 35.2 g/dL (ref 32.3–35.6)
MCV: 86.5 fL (ref 75.5–95.3)
MPV: 10.4 fL (ref 7.9–10.8)
PLATELETS: 205 10*3/uL (ref 140–440)
RBC: 4.99 10*6/uL — ABNORMAL HIGH (ref 3.63–4.92)
RDW: 12.7 % (ref 12.3–17.7)
WBC: 8.1 10*3/uL (ref 3.8–11.8)

## 2023-09-04 LAB — MAGNESIUM: MAGNESIUM: 2.3 mg/dL (ref 1.9–2.7)

## 2023-09-04 LAB — CORTISOL, PLASMA OR SERUM: CORTISOL: 7.1 ug/dL (ref 6.7–22.6)

## 2023-09-04 LAB — THYROID STIMULATING HORMONE WITH FREE T4 REFLEX: TSH: 1.167 u[IU]/mL (ref 0.450–5.330)

## 2023-09-04 LAB — VITAMIN B12: VITAMIN B 12: 319 pg/mL (ref 180–914)

## 2023-09-04 LAB — VITAMIN D 25 TOTAL: VITAMIN D 25, TOTAL: 39.43 ng/mL (ref 30.00–100.00)

## 2023-09-04 NOTE — Assessment & Plan Note (Addendum)
 Lab Results   Component Value Date    AST 16 09/04/2023    ALT 12 09/04/2023    Symptoms well controlled with Protonix  40 mg daily.  Continue current medications.

## 2023-09-04 NOTE — Assessment & Plan Note (Addendum)
 THYROID  STIMULATING HORMONE  Lab Results   Component Value Date    TSH 1.167 09/04/2023     Further treatment pending labs.  Continue levothyroxine  200 mcg daily.  Patient reports missed doses x3 weeks.  Advised to take levothyroxine  in the morning on an empty stomach and wait 30 minutes prior to eating/drinking.  Discussed the importance of medication compliance.  Patient verbalized understanding.  Recheck TSH in 4 weeks.

## 2023-09-04 NOTE — Assessment & Plan Note (Addendum)
 BMI 47. Advised a low-fat/low-sodium diet, advised 150 minutes of scheduled weekly physical activity as tolerated continue with weight loss efforts.

## 2023-09-04 NOTE — Assessment & Plan Note (Addendum)
 Lab Results   Component Value Date/Time    IRONBINDCAP 316 08/23/2022 09:47 AM    IRONSAT 16 08/23/2022 09:47 AM   FERRITIN  Lab Results   Component Value Date    FERRITIN 45 08/23/2022      Further treatment pending labs.

## 2023-09-04 NOTE — Assessment & Plan Note (Signed)
 Advised to follow up with Arville Bis, psychiatrist as scheduled.

## 2023-09-04 NOTE — Assessment & Plan Note (Signed)
No CPAP recommended. Advise to continue daily nasal spray, strongly advsied smoking cessation and encourage 10% weight loss goal.  Advised regular schedule aerobic exercise and strength training.  Advised to sleep on stomach or side. Avoid alcohol and sedatives.  Advised to set aside 8 hours each to sleep and discussed sleep training.

## 2023-09-04 NOTE — Progress Notes (Signed)
 [facility]  FAMILY MEDICINE, [facility] FAMILY MEDICINE [facility]  [address]  [place] [address]  Operated by [facility]     Name: Lindsey Daugherty MRN:  [id number]   Date of Birth: 01-27-1976 Age: 48 y.o.   Date: 09/04/2023  Time: 12:49     Provider: Valorie Gearing, NP-C    Reason for visit: Follow Up      History of Present Illness:  Lindsey Daugherty is a 48 y.o. female presenting with chronic disease management.    Patient reports she has been on her thyroid  medication x 4 weeks.  Reports she has restarted her medication in the past few weeks.    Continues to follow up with Psychiatry.    Patient Active Problem List    Diagnosis Date Noted    PTSD (post-traumatic stress disorder) 06/13/2023    Panic disorder without agoraphobia 08/23/2022    Mild sleep apnea 08/23/2022    Normal gynecologic examination 08/23/2022    Nontoxic single thyroid  nodule 08/23/2022    Noninflammatory disorder of vagina 08/23/2022    Non-toxic multinodular goiter 08/23/2022    Need for immunization against influenza 08/23/2022    Menorrhagia 08/23/2022     [name], GYN      Benign neoplasm of thyroid  gland 08/23/2022    Back pain 08/23/2022    Chronic joint pain 08/23/2022    Iron deficiency anemia 08/23/2022    Abnormal findings on diagnostic imaging of other specified body structures 08/23/2022    Weight loss advised 08/23/2022    Encounter for weight management 08/23/2022    Major depression 06/05/2022    Generalized anxiety disorder with panic attacks 06/05/2022     Managed by [employer]      Bereavement 05/08/2022    Environmental and seasonal allergies 05/08/2022    Hypertriglyceridemia 03/25/2022    Acquired hypothyroidism 09/19/2021    Tobacco dependence due to cigarettes 09/19/2021    Gastroesophageal reflux disease without esophagitis 09/19/2021    Vitamin D  deficiency 09/19/2021    B12 deficiency 09/19/2021    Morbid obesity with BMI of 45.0-49.9, adult (CMS HCC) 09/19/2021     Insomnia 06/11/2021     Managed by [facility]         Historical Data    Past Medical History:  Past Medical History:   Diagnosis Date    Acute vaginitis 08/23/2022    Bereavement 05/08/2022    Dermatitis, unspecified 08/23/2022    Dizziness and giddiness 08/23/2022    Dysmenorrhea 08/23/2022    Edema of lower extremity 07/24/2021    Esophageal reflux     Family history of cardiovascular disease 08/23/2022    Family history of malignant neoplasm of digestive organ 08/23/2022    Insomnia     Lightheadedness 08/23/2022    Migraine     Snoring 08/23/2022    Thyroid  cancer (CMS HCC) 2018    Vaginal odor 08/23/2022     Past Surgical History:  Past Surgical History:   Procedure Laterality Date    HX CHOLECYSTECTOMY  02/20/2022    HX PARTIAL THYROIDECTOMY Right 2018    No chemo/radiation; dx thyroid  cancer    HX PARTIAL THYROIDECTOMY Left 2020    no chemo/radiation    HX TONSILLECTOMY  1981     Allergies:  Allergies   Allergen Reactions    Loratadine Shortness of Breath and  Other Adverse Reaction (Add comment)     claritan D    Pseudoephedrine  Shortness of Breath    Sulfamethoxazole-Trimethoprim Itching and Hives/ Urticaria    Vitamin E Rash    Vitamin E-Safflower Oil     Vitamin E (D-Alpha Tocopherol) Itching     Medications:  Current Outpatient Medications   Medication Sig    ARIPiprazole  (ABILIFY ) 5 mg Oral Tablet Take 1 Tablet (5 mg total) by mouth Daily    ascorbic acid (SUPER C PO) Take 1 Tablet by mouth Once a day Super c with zinc    citalopram  (CELEXA ) 40 mg Oral Tablet Take 1 Tablet (40 mg total) by mouth Daily for 180 days    drospirenone, contraceptive, (SLYND) 4 mg (28) Oral Tablet Take 1 Tablet (4 mg total) by mouth Daily    furosemide (LASIX) 20 mg Oral Tablet Take 1 Tablet (20 mg total) by mouth Once per day as needed    gabapentin (NEURONTIN) 300 mg Oral Capsule Take 1 Capsule (300 mg total) by mouth Daily    levothyroxine  (SYNTHROID) 200 mcg Oral Tablet Take 1 Tablet (200 mcg total) by  mouth Every morning for 90 days    LORazepam  (ATIVAN ) 1 mg Oral Tablet Take 1 Tablet (1 mg total) by mouth Three times a day    lovastatin  (MEVACOR ) 20 mg Oral Tablet Take 1 Tablet (20 mg total) by mouth Every evening with dinner for 90 days    prazosin  (MINIPRESS ) 1 mg Oral Capsule Take 1 Capsule (1 mg total) by mouth Every evening    prenatal 21/iron fu/folic acid (PRENATAL COMPLETE ORAL) Take 1 Tablet by mouth Once a day    valACYclovir (VALTREX) 500 mg Oral Tablet Take 1 Tablet (500 mg total) by mouth Daily    zolpidem  (AMBIEN ) 10 mg Oral Tablet Take 1 Tablet (10 mg total) by mouth Every night     Family History:  Family Medical History:       Problem Relation (Age of Onset)    Diabetes type II Father    Elevated Lipids Father    Hypertension (High Blood Pressure) Father    No Known Problems Mother, Sister, Brother, Half-Sister, Half-Brother, Maternal Aunt, Maternal Uncle, Paternal Aunt, Paternal Uncle, Maternal Grandmother, Maternal Grandfather, Paternal Grandmother, Paternal Grandfather, Daughter, Son            Social History:  Social History     Socioeconomic History    Marital status: Divorced    Number of children: 0    Highest education level: Associate degree: academic program   Occupational History    Occupation: Pharmaceutical   Tobacco Use    Smoking status: Every Day     Current packs/day: 0.50     Average packs/day: 0.5 packs/day for 30.4 years (15.2 ttl pk-yrs)     Types: Cigarettes     Start date: 1995    Smokeless tobacco: Never    Tobacco comments:     Declines cessation   Vaping Use    Vaping status: Never Used   Substance and Sexual Activity    Alcohol use: Never    Drug use: Never    Sexual activity: Not Currently     Partners: Male     Comment: LMP: 12/04/21 ON ORAL CONTRACEPTION; G2P0A2     Social Determinants of Health     Financial Resource Strain: Low Risk  (11/25/2022)    Financial Resource Strain     SDOH Financial: No   Transportation Needs: Low Risk  (11/25/2022)    Transportation Needs      SDOH Transportation: No  Social Connections: Low Risk  (11/25/2022)    Social Connections     SDOH Social Isolation: 5 or more times a week   Intimate Partner Violence: Low Risk  (11/25/2022)    Intimate Partner Violence     SDOH Domestic Violence: No   Housing Stability: Low Risk  (11/25/2022)    Housing Stability     SDOH Housing Situation: I have housing.     SDOH Housing Worry: No           Review of Systems:  Any pertinent Review of Systems as addressed in the HPI above.    Physical Exam:  Vital Signs:  Vitals:    09/04/23 1059   BP: 133/80   Pulse: 71   Temp: 36.7 C (98 F)   SpO2: 99%   Weight: 129 kg (284 lb)     Physical Exam  Vitals reviewed.   Constitutional:       Appearance: Normal appearance. She is obese.   HENT:      Head: Normocephalic.      Mouth/Throat:      Pharynx: Oropharynx is clear.   Eyes:      Extraocular Movements: Extraocular movements intact.      Conjunctiva/sclera: Conjunctivae normal.      Pupils: Pupils are equal, round, and reactive to light.   Neck:      Thyroid : No thyroid  mass, thyromegaly or thyroid  tenderness.   Cardiovascular:      Rate and Rhythm: Normal rate and regular rhythm.      Pulses: Normal pulses.      Heart sounds: Normal heart sounds, S1 normal and S2 normal.   Pulmonary:      Effort: Pulmonary effort is normal.      Breath sounds: Normal breath sounds.   Abdominal:      General: Bowel sounds are normal.      Palpations: Abdomen is soft.   Musculoskeletal:         General: Normal range of motion.      Cervical back: Normal range of motion and neck supple.      Right lower leg: No edema.      Left lower leg: No edema.   Skin:     General: Skin is warm and dry.   Neurological:      General: No focal deficit present.      Mental Status: She is alert and oriented to person, place, and time. Mental status is at baseline.      Cranial Nerves: Cranial nerves 2-12 are intact.      Motor: Motor function is intact.      Coordination: Coordination is intact.      Gait: Gait  is intact.   Psychiatric:         Mood and Affect: Mood normal.         Behavior: Behavior normal. Behavior is cooperative.         Thought Content: Thought content normal.         Cognition and Memory: Cognition normal.         Judgment: Judgment normal.          Assessment/Plan:  Problem List Items Addressed This Visit          Cardiovascular System    Hypertriglyceridemia - Primary    Lab Results   Component Value Date    CHOLESTEROL 156 08/23/2022    HDLCHOL 32 (L) 08/23/2022    LDLCHOL 87 08/23/2022  TRIG 183 (H) 08/23/2022      Restart lovastatin  20 mg daily to lower triglycerides levels.  Advise to limit high fat foods, processed foods and fast foods in diet.  Discussed side effects.  Rechecked labs in 3 months.         Relevant Orders    CBC    COMPREHENSIVE METABOLIC PNL, FASTING    LIPID PANEL    MAGNESIUM    VITAMIN D  25 TOTAL    VITAMIN B12    CORTISOL, PLASMA OR SERUM (Completed)    THYROID  STIMULATING HORMONE WITH FREE T4 REFLEX (Completed)    THYROPEROXIDASE (TPO) ANTIBODIES, SERUM (Completed)       Respiratory    Mild sleep apnea    No CPAP recommended. Advise to continue daily nasal spray, strongly advsied smoking cessation and encourage 10% weight loss goal.  Advised regular schedule aerobic exercise and strength training.  Advised to sleep on stomach or side. Avoid alcohol and sedatives.  Advised to set aside 8 hours each to sleep and discussed sleep training.          Relevant Orders    CBC    COMPREHENSIVE METABOLIC PNL, FASTING    LIPID PANEL    MAGNESIUM    VITAMIN D  25 TOTAL    VITAMIN B12    CORTISOL, PLASMA OR SERUM (Completed)    THYROID  STIMULATING HORMONE WITH FREE T4 REFLEX (Completed)    THYROPEROXIDASE (TPO) ANTIBODIES, SERUM (Completed)       Neurologic    Insomnia    Discussed sleep hygiene.  Advise the importance of being on a sleep routine. Managed by [facility].         Relevant Orders    CBC    COMPREHENSIVE METABOLIC PNL, FASTING    LIPID PANEL    MAGNESIUM     VITAMIN D  25 TOTAL    VITAMIN B12    CORTISOL, PLASMA OR SERUM (Completed)    THYROID  STIMULATING HORMONE WITH FREE T4 REFLEX (Completed)    THYROPEROXIDASE (TPO) ANTIBODIES, SERUM (Completed)       Digestive    Gastroesophageal reflux disease without esophagitis    Lab Results   Component Value Date    AST 16 09/04/2023    ALT 12 09/04/2023    Symptoms well controlled with Protonix  40 mg daily.  Continue current medications.         Relevant Orders    CBC    COMPREHENSIVE METABOLIC PNL, FASTING    LIPID PANEL    MAGNESIUM    VITAMIN D  25 TOTAL    VITAMIN B12    CORTISOL, PLASMA OR SERUM (Completed)    THYROID  STIMULATING HORMONE WITH FREE T4 REFLEX (Completed)    THYROPEROXIDASE (TPO) ANTIBODIES, SERUM (Completed)       Endocrine    Acquired hypothyroidism    THYROID  STIMULATING HORMONE  Lab Results   Component Value Date    TSH 1.167 09/04/2023     Further treatment pending labs.  Continue levothyroxine  200 mcg daily.  Patient reports missed doses x3 weeks.  Advised to take levothyroxine  in the morning on an empty stomach and wait 30 minutes prior to eating/drinking.  Discussed the importance of medication compliance.  Patient verbalized understanding.  Recheck TSH in 4 weeks.         Relevant Orders    CBC    COMPREHENSIVE METABOLIC PNL, FASTING    LIPID PANEL    MAGNESIUM    VITAMIN D  25 TOTAL  VITAMIN B12    CORTISOL, PLASMA OR SERUM (Completed)    THYROID  STIMULATING HORMONE WITH FREE T4 REFLEX (Completed)    THYROPEROXIDASE (TPO) ANTIBODIES, SERUM (Completed)    Vitamin D  deficiency    Advised vitamin-D 4000 IU daily.         Relevant Orders    CBC    COMPREHENSIVE METABOLIC PNL, FASTING    LIPID PANEL    MAGNESIUM    VITAMIN D  25 TOTAL    VITAMIN B12    CORTISOL, PLASMA OR SERUM (Completed)    THYROID  STIMULATING HORMONE WITH FREE T4 REFLEX (Completed)    THYROPEROXIDASE (TPO) ANTIBODIES, SERUM (Completed)    B12 deficiency    VITAMIN B 12   Date Value Ref Range Status   09/04/2023 319 180 - 914 pg/mL  Final    Continue vitamin B12 1000 mcg daily.         Relevant Orders    CBC    COMPREHENSIVE METABOLIC PNL, FASTING    LIPID PANEL    MAGNESIUM    VITAMIN D  25 TOTAL    VITAMIN B12    CORTISOL, PLASMA OR SERUM (Completed)    THYROID  STIMULATING HORMONE WITH FREE T4 REFLEX (Completed)    THYROPEROXIDASE (TPO) ANTIBODIES, SERUM (Completed)       Hematologic/Lymphatic    Iron deficiency anemia    Lab Results   Component Value Date/Time    IRONBINDCAP 316 08/23/2022 09:47 AM    IRONSAT 16 08/23/2022 09:47 AM   FERRITIN  Lab Results   Component Value Date    FERRITIN 45 08/23/2022      Further treatment pending labs.         Relevant Orders    CBC    COMPREHENSIVE METABOLIC PNL, FASTING    LIPID PANEL    MAGNESIUM    VITAMIN D  25 TOTAL    VITAMIN B12    CORTISOL, PLASMA OR SERUM (Completed)    THYROID  STIMULATING HORMONE WITH FREE T4 REFLEX (Completed)    THYROPEROXIDASE (TPO) ANTIBODIES, SERUM (Completed)       Psychiatric    Tobacco dependence due to cigarettes    Failed therapy on Chantix , prescribed by Dr Denver Flaming, [facility].  Patient reports she is not interested in smoking cessation at time.         Generalized anxiety disorder with panic attacks    Advised to follow up with Arville Bis, psychiatrist as scheduled.         Relevant Orders    CBC    COMPREHENSIVE METABOLIC PNL, FASTING    LIPID PANEL    MAGNESIUM    VITAMIN D  25 TOTAL    VITAMIN B12    CORTISOL, PLASMA OR SERUM (Completed)    THYROID  STIMULATING HORMONE WITH FREE T4 REFLEX (Completed)    THYROPEROXIDASE (TPO) ANTIBODIES, SERUM (Completed)       Other    Morbid obesity with BMI of 45.0-49.9, adult (CMS HCC)    BMI 47. Advised a low-fat/low-sodium diet, advised 150 minutes of scheduled weekly physical activity as tolerated continue with weight loss efforts.           Relevant Orders    CBC    COMPREHENSIVE METABOLIC PNL, FASTING    LIPID PANEL    MAGNESIUM    VITAMIN D  25 TOTAL    VITAMIN B12    CORTISOL, PLASMA OR SERUM (Completed)    THYROID   STIMULATING HORMONE WITH FREE T4 REFLEX (Completed)    THYROPEROXIDASE (TPO)  ANTIBODIES, SERUM (Completed)    Environmental and seasonal allergies    Advised to take an over-the-counter daily antihistamine.  Start fluticasone  nasal spray 1 spray each nostril daily.  Strongly advised smoking cessation.  Hydrate well.             Relevant Orders    CBC    COMPREHENSIVE METABOLIC PNL, FASTING    LIPID PANEL    MAGNESIUM    VITAMIN D  25 TOTAL    VITAMIN B12    CORTISOL, PLASMA OR SERUM (Completed)    THYROID  STIMULATING HORMONE WITH FREE T4 REFLEX (Completed)    THYROPEROXIDASE (TPO) ANTIBODIES, SERUM (Completed)        Orders Placed This Encounter    CBC    COMPREHENSIVE METABOLIC PNL, FASTING    LIPID PANEL    MAGNESIUM    VITAMIN D  25 TOTAL    VITAMIN B12    CORTISOL, PLASMA OR SERUM    THYROID  STIMULATING HORMONE WITH FREE T4 REFLEX    THYROPEROXIDASE (TPO) ANTIBODIES, SERUM      Tobacco cessation counseling performed.      Labs drawn today.  Continue current medications.  Advised a low-fat/low sodium diet, advised 150 minutes of scheduled weekly physical activity as tolerated.  Advised to maintain a healthy weight.     Return in about 4 weeks (around 10/02/2023) for recheck thyroid ; 3 months routine visit.    Valorie Gearing, NP-C     Portions of this note may be dictated using voice recognition software or a dictation service. Variances in spelling and vocabulary are possible and unintentional. Not all errors are caught/corrected. Please notify the [facility] if any discrepancies are noted or if the meaning of any statement is not clear.

## 2023-09-04 NOTE — Assessment & Plan Note (Signed)
Advised vitamin-D 4000 IU daily.

## 2023-09-04 NOTE — Nursing Note (Signed)
 Patient here for lab and thyroid  follow up

## 2023-09-04 NOTE — Assessment & Plan Note (Addendum)
 VITAMIN B 12   Date Value Ref Range Status   09/04/2023 319 180 - 914 pg/mL Final    Continue vitamin B12 1000 mcg daily.

## 2023-09-04 NOTE — Assessment & Plan Note (Addendum)
 Lab Results   Component Value Date    CHOLESTEROL 156 08/23/2022    HDLCHOL 32 (L) 08/23/2022    LDLCHOL 87 08/23/2022    TRIG 183 (H) 08/23/2022      Restart lovastatin  20 mg daily to lower triglycerides levels.  Advise to limit high fat foods, processed foods and fast foods in diet.  Discussed side effects.  Rechecked labs in 3 months.

## 2023-09-04 NOTE — Assessment & Plan Note (Signed)
Advised to take an over-the-counter daily antihistamine.  Start fluticasone nasal spray 1 spray each nostril daily.  Strongly advised smoking cessation.  Hydrate well.

## 2023-09-04 NOTE — Assessment & Plan Note (Signed)
 Discussed sleep hygiene.  Advise the importance of being on a sleep routine. Managed by [facility].

## 2023-09-04 NOTE — Assessment & Plan Note (Signed)
 Insurance does not pay for weight loss medications.

## 2023-09-08 LAB — THYROPEROXIDASE (TPO) ANTIBODIES, SERUM: ANTI THYROPEROXIDASE ANTIBODIES: 18 [IU]/mL — ABNORMAL HIGH (ref <=14)

## 2023-09-11 ENCOUNTER — Ambulatory Visit (HOSPITAL_PSYCHIATRIC): Payer: Self-pay | Admitting: COUNSELOR-PROFESSIONAL

## 2023-09-15 ENCOUNTER — Encounter (RURAL_HEALTH_CENTER): Payer: Self-pay | Admitting: Family

## 2023-09-15 NOTE — Assessment & Plan Note (Signed)
 Failed therapy on Chantix , prescribed by Dr Denver Flaming, [facility].  Patient reports she is not interested in smoking cessation at time.

## 2023-09-30 ENCOUNTER — Telehealth (RURAL_HEALTH_CENTER): Payer: Self-pay | Admitting: Family

## 2023-09-30 ENCOUNTER — Other Ambulatory Visit (RURAL_HEALTH_CENTER): Payer: Self-pay | Admitting: Family

## 2023-09-30 ENCOUNTER — Ambulatory Visit: Attending: Family | Admitting: Family

## 2023-09-30 ENCOUNTER — Other Ambulatory Visit: Payer: Self-pay

## 2023-09-30 ENCOUNTER — Ambulatory Visit (RURAL_HEALTH_CENTER): Payer: Self-pay | Attending: Family | Admitting: Family

## 2023-09-30 ENCOUNTER — Encounter (RURAL_HEALTH_CENTER): Payer: Self-pay | Admitting: Family

## 2023-09-30 VITALS — BP 122/78 | HR 80 | Temp 98.2°F | Ht 65.0 in | Wt 287.0 lb

## 2023-09-30 DIAGNOSIS — Z6841 Body Mass Index (BMI) 40.0 and over, adult: Secondary | ICD-10-CM | POA: Insufficient documentation

## 2023-09-30 DIAGNOSIS — Z0000 Encounter for general adult medical examination without abnormal findings: Secondary | ICD-10-CM | POA: Insufficient documentation

## 2023-09-30 DIAGNOSIS — Z1211 Encounter for screening for malignant neoplasm of colon: Secondary | ICD-10-CM | POA: Insufficient documentation

## 2023-09-30 DIAGNOSIS — Z131 Encounter for screening for diabetes mellitus: Secondary | ICD-10-CM | POA: Insufficient documentation

## 2023-09-30 DIAGNOSIS — F17210 Nicotine dependence, cigarettes, uncomplicated: Secondary | ICD-10-CM | POA: Insufficient documentation

## 2023-09-30 LAB — CBC
HCT: 42.2 % — ABNORMAL HIGH (ref 31.2–41.9)
HGB: 14.5 g/dL — ABNORMAL HIGH (ref 10.9–14.3)
MCH: 29.8 pg (ref 24.7–32.8)
MCHC: 34.3 g/dL (ref 32.3–35.6)
MCV: 86.8 fL (ref 75.5–95.3)
MPV: 10.5 fL (ref 7.9–10.8)
PLATELETS: 200 10*3/uL (ref 140–440)
RBC: 4.86 10*6/uL (ref 3.63–4.92)
RDW: 13.1 % (ref 12.3–17.7)
WBC: 8 10*3/uL (ref 3.8–11.8)

## 2023-09-30 LAB — COMPREHENSIVE METABOLIC PANEL, NON-FASTING
ALBUMIN/GLOBULIN RATIO: 1.3 (ref 0.8–1.4)
ALBUMIN: 4 g/dL (ref 3.5–5.7)
ALKALINE PHOSPHATASE: 39 U/L (ref 34–104)
ALT (SGPT): 9 U/L (ref 7–52)
ANION GAP: 7 mmol/L (ref 4–13)
AST (SGOT): 12 U/L — ABNORMAL LOW (ref 13–39)
BILIRUBIN TOTAL: 0.5 mg/dL (ref 0.3–1.0)
BUN/CREA RATIO: 15 (ref 6–22)
BUN: 13 mg/dL (ref 7–25)
CALCIUM, CORRECTED: 8.8 mg/dL — ABNORMAL LOW (ref 8.9–10.8)
CALCIUM: 8.8 mg/dL (ref 8.6–10.3)
CHLORIDE: 106 mmol/L (ref 98–107)
CO2 TOTAL: 25 mmol/L (ref 21–31)
CREATININE: 0.84 mg/dL (ref 0.60–1.30)
ESTIMATED GFR: 85 mL/min/{1.73_m2} (ref >59)
GLOBULIN: 3 (ref 2.0–3.5)
GLUCOSE: 90 mg/dL (ref 74–109)
OSMOLALITY, CALCULATED: 275 mosm/kg (ref 270–290)
POTASSIUM: 3.8 mmol/L (ref 3.5–5.1)
PROTEIN TOTAL: 7 g/dL (ref 6.4–8.9)
SODIUM: 138 mmol/L (ref 136–145)

## 2023-09-30 LAB — POCT URINE DIPSTICK
BLOOD: NEGATIVE
GLUCOSE: NEGATIVE
KETONE: NEGATIVE
LEUKOCYTES: NEGATIVE
NITRITE: NEGATIVE
PH: 5.5
PROTEIN: NEGATIVE
SPECIFIC GRAVITY: 1.015
UROBILINOGEN: 0.2

## 2023-09-30 MED ORDER — HYDROCHLOROTHIAZIDE 12.5 MG CAPSULE
12.5000 mg | ORAL_CAPSULE | Freq: Every day | ORAL | 2 refills | Status: DC
Start: 2023-09-30 — End: 2023-12-01

## 2023-09-30 NOTE — Nursing Note (Signed)
Patient here for wellness visit

## 2023-09-30 NOTE — Progress Notes (Signed)
 [facility] MEDICINE [facility]  [facility] FAMILY MEDICINE      Lindsey Daugherty  MRN: [id number]  DOB: 1975-05-07  Date of Service: 09/30/2023    CHIEF COMPLAINT  Chief Complaint   Patient presents with    Well Check Adult       HISTORY OF PRESENT ILLNESS  Lindsey Daugherty is a 48 y.o. female presenting to clinic to Adult Wellness Visit.     Patient Active Problem List    Diagnosis Date Noted    Encounter for adult wellness visit 09/30/2023    Diabetes mellitus screening 09/30/2023    Colon cancer screening 09/30/2023    PTSD (post-traumatic stress disorder) 06/13/2023    Panic disorder without agoraphobia 08/23/2022    Mild sleep apnea 08/23/2022    Normal gynecologic examination 08/23/2022    Nontoxic single thyroid  nodule 08/23/2022    Noninflammatory disorder of vagina 08/23/2022    Non-toxic multinodular goiter 08/23/2022    Need for immunization against influenza 08/23/2022    Menorrhagia 08/23/2022     [name], GYN      Benign neoplasm of thyroid  gland 08/23/2022    Back pain 08/23/2022    Chronic joint pain 08/23/2022    Iron deficiency anemia 08/23/2022    Abnormal findings on diagnostic imaging of other specified body structures 08/23/2022    Weight loss advised 08/23/2022    Encounter for weight management 08/23/2022    Major depression 06/05/2022    Generalized anxiety disorder with panic attacks 06/05/2022     Managed by [employer]      Bereavement 05/08/2022    Environmental and seasonal allergies 05/08/2022    Hypertriglyceridemia 03/25/2022    Acquired hypothyroidism 09/19/2021    Tobacco dependence due to cigarettes 09/19/2021    Gastroesophageal reflux disease without esophagitis 09/19/2021    Vitamin D  deficiency 09/19/2021    B12 deficiency 09/19/2021    Morbid obesity with BMI of 45.0-49.9, adult (CMS HCC) 09/19/2021    Insomnia 06/11/2021     Managed by [facility]         PAST MEDICAL HISTORY  Past Medical History:   Diagnosis Date    Acute vaginitis  08/23/2022    Bereavement 05/08/2022    Dermatitis, unspecified 08/23/2022    Dizziness and giddiness 08/23/2022    Dysmenorrhea 08/23/2022    Edema of lower extremity 07/24/2021    Esophageal reflux     Family history of cardiovascular disease 08/23/2022    Family history of malignant neoplasm of digestive organ 08/23/2022    Insomnia     Lightheadedness 08/23/2022    Migraine     Snoring 08/23/2022    Thyroid  cancer (CMS HCC) 2018    Vaginal odor 08/23/2022        MEDICATIONS  ARIPiprazole  (ABILIFY ) 5 mg Oral Tablet, Take 1 Tablet (5 mg total) by mouth Daily  ascorbic acid (SUPER C PO), Take 1 Tablet by mouth Once a day Super c with zinc  citalopram  (CELEXA ) 40 mg Oral Tablet, Take 1 Tablet (40 mg total) by mouth Daily for 180 days  drospirenone, contraceptive, (SLYND) 4 mg (28) Oral Tablet, Take 1 Tablet (4 mg total) by mouth Daily  fluticasone  propionate (FLONASE ) 50 mcg/actuation Nasal Spray, Suspension, Administer 1 Spray into each nostril Once a day for 90 days  furosemide (LASIX) 20 mg Oral Tablet, Take 1 Tablet (20 mg total) by mouth Once per day as needed  gabapentin (NEURONTIN) 300 mg Oral Capsule, Take 1  Capsule (300 mg total) by mouth Daily  Ibuprofen  (MOTRIN ) 800 mg Oral Tablet, Take 1 Tablet (800 mg total) by mouth Three times a day as needed for Pain  levothyroxine  (SYNTHROID) 200 mcg Oral Tablet, Take 1 Tablet (200 mcg total) by mouth Every morning for 90 days  LORazepam  (ATIVAN ) 1 mg Oral Tablet, Take 1 Tablet (1 mg total) by mouth Three times a day  lovastatin  (MEVACOR ) 20 mg Oral Tablet, Take 1 Tablet (20 mg total) by mouth Every evening with dinner for 90 days  pantoprazole  (PROTONIX ) 40 mg Oral Tablet, Delayed Release (E.C.), Take 1 Tablet (40 mg total) by mouth Once a day for 90 days  prazosin  (MINIPRESS ) 1 mg Oral Capsule, Take 1 Capsule (1 mg total) by mouth Every evening  prenatal 21/iron fu/folic acid (PRENATAL COMPLETE ORAL), Take 1 Tablet by mouth Once a day  valACYclovir (VALTREX) 500  mg Oral Tablet, Take 1 Tablet (500 mg total) by mouth Daily  zolpidem  (AMBIEN ) 10 mg Oral Tablet, Take 1 Tablet (10 mg total) by mouth Every night  ZYRTEC  10 mg Oral Tablet, Take 1 Tablet (10 mg total) by mouth Once a day for 90 days    No facility-administered medications prior to visit.      ALLERGIES  Allergies[1]    PAST SURGICAL HISTORY  Past Surgical History:   Procedure Laterality Date    HX CHOLECYSTECTOMY  02/20/2022    HX PARTIAL THYROIDECTOMY Right 2018    No chemo/radiation; dx thyroid  cancer    HX PARTIAL THYROIDECTOMY Left 2020    no chemo/radiation    HX TONSILLECTOMY  1981       IMMUNIZATIONS  Immunization History   Administered Date(s) Administered    Covid-19 Vaccine,Moderna,12 Years+ 10/01/2019, 10/29/2019    Influenza Vaccine, 6 month-adult 02/10/2014, 01/31/2015, 03/21/2021, 02/04/2022       FAMILY HISTORY  Family Medical History:       Problem Relation (Age of Onset)    Diabetes type II Father    Elevated Lipids Father    Hypertension (High Blood Pressure) Father    No Known Problems Mother, Sister, Brother, Half-Sister, Half-Brother, Maternal Aunt, Maternal Uncle, Paternal Aunt, Paternal Uncle, Maternal Grandmother, Maternal Grandfather, Paternal Grandmother, Paternal Grandfather, Daughter, Son              SOCIAL HISTORY  Social History     Socioeconomic History    Marital status: Divorced    Number of children: 0    Highest education level: Associate degree: academic program   Occupational History    Occupation: Pharmaceutical   Tobacco Use    Smoking status: Every Day     Current packs/day: 0.50     Average packs/day: 0.5 packs/day for 30.4 years (15.2 ttl pk-yrs)     Types: Cigarettes     Start date: 1995    Smokeless tobacco: Never    Tobacco comments:     Declines cessation   Vaping Use    Vaping status: Never Used   Substance and Sexual Activity    Alcohol use: Never    Drug use: Never    Sexual activity: Not Currently     Partners: Male     Comment: LMP: 12/04/21 ON ORAL CONTRACEPTION;  G2P0A2     Social Determinants of Health     Financial Resource Strain: Low Risk  (11/25/2022)    Financial Resource Strain     SDOH Financial: No   Transportation Needs: Low Risk  (11/25/2022)    Transportation  Needs     SDOH Transportation: No   Social Connections: Low Risk  (11/25/2022)    Social Connections     SDOH Social Isolation: 5 or more times a week   Intimate Partner Violence: Low Risk  (11/25/2022)    Intimate Partner Violence     SDOH Domestic Violence: No   Housing Stability: Low Risk  (11/25/2022)    Housing Stability     SDOH Housing Situation: I have housing.     SDOH Housing Worry: No       REVIEW OF SYSTEMS  Positive ROS discussed in HPI, otherwise all other systems negative.      PHYSICAL EXAM  Vitals: Blood pressure 122/78, pulse 80, temperature 36.8 C (98.2 F), height 1.651 m (5' 5), weight 130 kg (287 lb), SpO2 97%. Body mass index is 47.76 kg/m.    Vision screening: Right eye 20/40, left eye 20/30.    Physical Exam  Vitals reviewed.   Constitutional:       Appearance: She is obese.   HENT:      Head: Normocephalic.      Right Ear: Tympanic membrane, ear canal and external ear normal.      Left Ear: Tympanic membrane, ear canal and external ear normal.      Nose: Nose normal.   Eyes:      Extraocular Movements: Extraocular movements intact.      Conjunctiva/sclera: Conjunctivae normal.      Pupils: Pupils are equal, round, and reactive to light.   Cardiovascular:      Rate and Rhythm: Normal rate and regular rhythm.      Pulses: Normal pulses.      Heart sounds: Normal heart sounds, S1 normal and S2 normal.   Pulmonary:      Effort: Pulmonary effort is normal.      Breath sounds: Normal breath sounds.   Abdominal:      General: Bowel sounds are normal.      Palpations: Abdomen is soft.      Comments: Waist circumference: 46 inches   Musculoskeletal:         General: Normal range of motion.      Cervical back: Neck supple.      Right lower leg: 1+ Edema present.      Left lower leg: 1+ Edema  present.   Skin:     General: Skin is warm.      Capillary Refill: Capillary refill takes less than 2 seconds.   Neurological:      General: No focal deficit present.      Mental Status: She is alert and oriented to person, place, and time.      Cranial Nerves: Cranial nerves 2-12 are intact.      Motor: Motor function is intact.      Coordination: Coordination is intact.      Gait: Gait is intact.   Psychiatric:         Mood and Affect: Mood normal.         Speech: Speech normal.         Behavior: Behavior normal. Behavior is cooperative.         Cognition and Memory: Cognition normal.          ASSESSMENT AND PLAN  (Z00.00) Encounter for adult wellness visit  (primary encounter diagnosis)  Plan: HGA1C (HEMOGLOBIN A1C WITH EST AVG GLUCOSE),         CBC, COMPREHENSIVE METABOLIC PANEL,  NON-FASTING, CANCELED: CBC, CANCELED:         COMPREHENSIVE METABOLIC PANEL, NON-FASTING    (Z13.1) Diabetes mellitus screening  Plan: HGA1C (HEMOGLOBIN A1C WITH EST AVG GLUCOSE),         CBC, COMPREHENSIVE METABOLIC PANEL,         NON-FASTING, CANCELED: CBC, CANCELED:         COMPREHENSIVE METABOLIC PANEL, NON-FASTING    (Z12.11) Colon cancer screening  Plan: Referral to GENERAL SURGERY - [place] -         DUREMDES, [place]INS, [place]    (F17.210) Tobacco dependence due to cigarettes  Plan: Referral to TOBACCO CESSATION - [place] -         [place]    Tobacco cessation counseling performed.      Orders Placed This Encounter    CANCELED: CBC    CANCELED: COMPREHENSIVE METABOLIC PANEL, NON-FASTING    HGA1C (HEMOGLOBIN A1C WITH EST AVG GLUCOSE)    CBC    COMPREHENSIVE METABOLIC PANEL, NON-FASTING    Referral to GENERAL SURGERY - [place] - DUREMDES, [place], [place]    Referral to TOBACCO CESSATION - [place] - [place]      Further treatment pending last.  Discussed immunizations, patient declined Tdap vaccine.  Advised to follow up with specialist as scheduled.  Advised follow up annually with ophthalmology, [facility]; scheduled appt 10/20/2023.  Advised to follow-up annually with Gynecology.  Advised a low-fat/low-sodium diet, advised 150 minutes of weekly scheduled physical activity and to maintain a healthy weight.    Return in about 1 year (around 09/29/2024) for adult wellness visit.      Valorie Gearing, NP-C 09/30/2023, 10:06         [1]   Allergies  Allergen Reactions    Loratadine Shortness of Breath and  Other Adverse Reaction (Add comment)     claritan D    Pseudoephedrine Shortness of Breath    Sulfamethoxazole-Trimethoprim Itching and Hives/ Urticaria    Vitamin E Rash    Vitamin E-Safflower Oil     Vitamin E (D-Alpha Tocopherol) Itching

## 2023-10-01 LAB — HGA1C (HEMOGLOBIN A1C WITH EST AVG GLUCOSE): HEMOGLOBIN A1C: 5.6 % (ref 4.0–6.0)

## 2023-10-01 NOTE — Telephone Encounter (Signed)
 Done

## 2023-10-02 ENCOUNTER — Ambulatory Visit: Payer: Self-pay | Admitting: PHYSICIAN ASSISTANT

## 2023-10-02 ENCOUNTER — Ambulatory Visit (RURAL_HEALTH_CENTER): Payer: Self-pay | Admitting: Family

## 2023-10-08 ENCOUNTER — Other Ambulatory Visit (RURAL_HEALTH_CENTER): Payer: Self-pay

## 2023-10-16 ENCOUNTER — Other Ambulatory Visit: Payer: Self-pay

## 2023-10-17 ENCOUNTER — Ambulatory Visit: Attending: PHYSICIAN ASSISTANT | Admitting: PHYSICIAN ASSISTANT

## 2023-10-17 ENCOUNTER — Encounter (HOSPITAL_PSYCHIATRIC): Payer: Self-pay | Admitting: PHYSICIAN ASSISTANT

## 2023-10-17 VITALS — BP 129/83 | HR 88 | Resp 18 | Ht 65.0 in | Wt 278.0 lb

## 2023-10-17 DIAGNOSIS — F329 Major depressive disorder, single episode, unspecified: Secondary | ICD-10-CM

## 2023-10-17 DIAGNOSIS — F339 Major depressive disorder, recurrent, unspecified: Secondary | ICD-10-CM | POA: Insufficient documentation

## 2023-10-17 DIAGNOSIS — F411 Generalized anxiety disorder: Secondary | ICD-10-CM

## 2023-10-17 DIAGNOSIS — Z634 Disappearance and death of family member: Secondary | ICD-10-CM

## 2023-10-17 DIAGNOSIS — G4700 Insomnia, unspecified: Secondary | ICD-10-CM

## 2023-10-17 DIAGNOSIS — F410 Panic disorder [episodic paroxysmal anxiety] without agoraphobia: Secondary | ICD-10-CM

## 2023-10-17 DIAGNOSIS — F4310 Post-traumatic stress disorder, unspecified: Secondary | ICD-10-CM

## 2023-10-17 MED ORDER — ZOLPIDEM 10 MG TABLET
10.0000 mg | ORAL_TABLET | Freq: Every evening | ORAL | 1 refills | Status: DC
Start: 2023-10-17 — End: 2023-12-10

## 2023-10-17 MED ORDER — LORAZEPAM 1 MG TABLET
1.0000 mg | ORAL_TABLET | Freq: Three times a day (TID) | ORAL | 1 refills | Status: DC
Start: 2023-10-17 — End: 2023-12-10

## 2023-10-17 MED ORDER — CITALOPRAM 40 MG TABLET
40.0000 mg | ORAL_TABLET | Freq: Every day | ORAL | 1 refills | Status: DC
Start: 2023-10-17 — End: 2023-12-10

## 2023-10-17 MED ORDER — IBUPROFEN 800 MG TABLET
800.0000 mg | ORAL_TABLET | Freq: Three times a day (TID) | ORAL | 1 refills | Status: AC | PRN
Start: 2023-10-17 — End: ?

## 2023-10-17 MED ORDER — ARIPIPRAZOLE 5 MG TABLET
5.0000 mg | ORAL_TABLET | Freq: Every day | ORAL | 1 refills | Status: DC
Start: 2023-10-17 — End: 2023-12-08

## 2023-10-17 MED ORDER — PRAZOSIN 1 MG CAPSULE
1.0000 mg | ORAL_CAPSULE | Freq: Every evening | ORAL | 1 refills | Status: DC
Start: 2023-10-17 — End: 2023-12-08

## 2023-10-17 NOTE — Progress Notes (Signed)
 BEHAVIORAL MEDICINE, [facility]  [address]  [place] [address]  Operated by [facility]  Progress Note    Name: Lindsey Daugherty MRN:  [id number]   Date: 10/17/2023 DOB:  1975-10-18 (48 y.o.)           Chief Complaint: PTSD, Panic Disorder, MDD, Bereavement, Insomnia, and Generalized Anxiety    Subjective:   Patient reports that she is taking her time and taking things one day at a time. She has days that she breaks down and it is usually an all day crying day, but it is better. 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 Her brother is 41 years old and has recently been diagnosed with Dementia. He lives in [place] and his wife is a NP. They put him on Donepezil twice daily and then added Namenda. The patient went down to visit last weekend. They had a good visit. They are supposed to come up soon to visit her. Her next-door neighbor is dealing with her father, who has cancer and she was put in jail on Friday. The neighbor was an only child and was her father's only care-giver. The patient feels that everything is on her now. They have been neighbors for over 20 years. The situation has increased her anxiety and pushed her to panic attacks.  She will be starting back to therapy at the end of July. She still has support from her church and her fiance's family. His sister is actually moving back to the area from [place]. His real mother is moving back to [place] . August 30th they plan to spread his ashes in his old neighborhood. She is back to work and it has been busy, which keeps her mind occupied.She has been trying to work around the house. She has become involved in church and helped with [employer]. Her puppy still limps, but he is doing better. She had to take him to the vet due to bloody vomiting and diarrhea. They diagnosed HGE. She went to the doctor last week and was put on HCTZ due to her feet  swelling.   Mood: pretty good  Medications: Working well with no ill effects.    Appetite: Eating better, probably two meals per day. She drinks a protein drink in the morning and a meal late in the evening.   Sleep She is sleeping fairly well with her window AC unit.  Energy: Improved.  Stressors: Recent loss, dog's health.   Problem List[1]  Past Medical History[2]  Past Surgical History[3]  Family History[4]  Social History     Socioeconomic History    Marital status: Divorced    Number of children: 0    Highest education level: Associate degree: academic program   Occupational History    Occupation: Pharmaceutical   Tobacco Use    Smoking status: Every Day     Current packs/day: 0.50     Average packs/day: 0.5 packs/day for 30.5 years (15.2 ttl pk-yrs)     Types: Cigarettes     Start date: 1995    Smokeless tobacco: Never    Tobacco comments:     Declines cessation   Vaping Use    Vaping status: Never Used   Substance and Sexual Activity    Alcohol use: Never    Drug use: Never    Sexual activity: Not Currently     Partners: Male     Comment: LMP: 12/04/21 ON  ORAL CONTRACEPTION; G2P0A2     Social Determinants of Health     Financial Resource Strain: Low Risk  (11/25/2022)    Financial Resource Strain     SDOH Financial: No   Transportation Needs: Low Risk  (11/25/2022)    Transportation Needs     SDOH Transportation: No   Social Connections: Low Risk  (11/25/2022)    Social Connections     SDOH Social Isolation: 5 or more times a week   Intimate Partner Violence: Low Risk  (11/25/2022)    Intimate Partner Violence     SDOH Domestic Violence: No   Housing Stability: Low Risk  (11/25/2022)    Housing Stability     SDOH Housing Situation: I have housing.     SDOH Housing Worry: No      Loratadine, Pseudoephedrine, Sulfamethoxazole-trimethoprim, Vitamin e, Vitamin e-safflower oil, and Vitamin e (d-alpha tocopherol)   Current Outpatient Medications   Medication Sig    ARIPiprazole  (ABILIFY ) 5 mg Oral Tablet Take 1 Tablet (5  mg total) by mouth Daily    ascorbic acid (SUPER C PO) Take 1 Tablet by mouth Once a day Super c with zinc    Cholecalciferol, Vitamin D3, (VITAMIN D -3) 50 mcg (2,000 unit) Oral Capsule Take 1 Capsule by mouth Daily    citalopram  (CELEXA ) 40 mg Oral Tablet Take 1 Tablet (40 mg total) by mouth Daily for 180 days    drospirenone, contraceptive, (SLYND) 4 mg (28) Oral Tablet Take 1 Tablet (4 mg total) by mouth Daily    fluticasone  propionate (FLONASE ) 50 mcg/actuation Nasal Spray, Suspension Administer 1 Spray into each nostril Once a day for 90 days    gabapentin (NEURONTIN) 300 mg Oral Capsule Take 1 Capsule (300 mg total) by mouth Daily    hydroCHLOROthiazide  (MICROZIDE ) 12.5 mg Oral Capsule Take 1 Capsule (12.5 mg total) by mouth Daily for 30 days    Ibuprofen  (MOTRIN ) 800 mg Oral Tablet Take 1 Tablet (800 mg total) by mouth Three times a day as needed for Pain    levothyroxine  (SYNTHROID) 200 mcg Oral Tablet Take 1 Tablet (200 mcg total) by mouth Every morning for 90 days    LORazepam  (ATIVAN ) 1 mg Oral Tablet Take 1 Tablet (1 mg total) by mouth Three times a day    lovastatin  (MEVACOR ) 20 mg Oral Tablet Take 1 Tablet (20 mg total) by mouth Every evening with dinner for 90 days    pantoprazole  (PROTONIX ) 40 mg Oral Tablet, Delayed Release (E.C.) Take 1 Tablet (40 mg total) by mouth Once a day for 90 days    prazosin  (MINIPRESS ) 1 mg Oral Capsule Take 1 Capsule (1 mg total) by mouth Every evening    prenatal 21/iron fu/folic acid (PRENATAL COMPLETE ORAL) Take 1 Tablet by mouth Once a day    valACYclovir (VALTREX) 500 mg Oral Tablet Take 1 Tablet (500 mg total) by mouth Daily    vitamin B complex (B COMPLEX-VITAMIN B12) Oral Tablet Take 1 Tablet by mouth Daily    zolpidem  (AMBIEN ) 10 mg Oral Tablet Take 1 Tablet (10 mg total) by mouth Every night    ZYRTEC  10 mg Oral Tablet Take 1 Tablet (10 mg total) by mouth Once a day for 90 days      Objective :  BP 129/83   Pulse 88   Resp 18   Ht 1.651 m (5' 5)   Wt 126  kg (278 lb)   BMI 46.26 kg/m       PHQ  Total Score  PHQ 2 Total: 6  PHQ 9 Total: 22  Interpretation of Total Score: 20-27 Severe depression             08/01/2023     9:43 AM 09/01/2023     8:45 AM 10/17/2023     8:58 AM   Most Recent PHQ-9 Scores   PHQ 9 Total 24 24 22         Mental Status Exam  AXOX4. Casual dress, calm, well-groomed. No SI, HI, AVH, delusions, or paranoia. Thoughts are logical, coherent, and goal-directed. Good eye contact. Speech is normal in rate and tone. Mood is pretty good affect congruent. No psychomotor agitation or retardation, cogwheel rigidity, or abnormal movements. Gait is normal. Attention, concentration, and memory are good. No cognitive deficits noted. Judgment and insight are fair. Calculation and abstraction are within normal limits.     Data Reviewed  I have reviewed patient's previous note medical, surgical, family, and social history in detail today,     Assessment  PTSD, Panic Disorder, MDD, Bereavement, Insomnia, and Generalized Anxiety    Plan  Continue the following medications:  Celexa  40 mg daily for mood.  Abilify  5 mg daily for mood.  Ativan  1 mg tid for anxiety.  Ambien  10 mg qhs/prn for sleep.   Prazosin  1 mg qhs for sleep.  Patient to start back to therapy at the end of July.  Recommended Vital Proteins.      Follow up  Return to clinic in 6 weeks.    Adrien Herd, PA-C          [1]   Patient Active Problem List  Diagnosis    Acquired hypothyroidism    Tobacco dependence due to cigarettes    Gastroesophageal reflux disease without esophagitis    Insomnia    Vitamin D  deficiency    B12 deficiency    Morbid obesity with BMI of 45.0-49.9, adult (CMS HCC)    Hypertriglyceridemia    Bereavement    Environmental and seasonal allergies    Major depression    Generalized anxiety disorder with panic attacks    Panic disorder without agoraphobia    Mild sleep apnea    Normal gynecologic examination    Nontoxic single thyroid  nodule    Noninflammatory disorder of vagina     Non-toxic multinodular goiter    Need for immunization against influenza    Menorrhagia    Benign neoplasm of thyroid  gland    Back pain    Chronic joint pain    Iron deficiency anemia    Abnormal findings on diagnostic imaging of other specified body structures    Weight loss advised    Encounter for weight management    PTSD (post-traumatic stress disorder)    Encounter for adult wellness visit    Diabetes mellitus screening    Colon cancer screening   [2]   Past Medical History:  Diagnosis Date    Acute vaginitis 08/23/2022    Bereavement 05/08/2022    Dermatitis, unspecified 08/23/2022    Dizziness and giddiness 08/23/2022    Dysmenorrhea 08/23/2022    Edema of lower extremity 07/24/2021    Esophageal reflux     Family history of cardiovascular disease 08/23/2022    Family history of malignant neoplasm of digestive organ 08/23/2022    Insomnia     Lightheadedness 08/23/2022    Migraine     Snoring 08/23/2022    Thyroid  cancer (CMS HCC) 2018    Vaginal odor 08/23/2022   [  3]   Past Surgical History:  Procedure Laterality Date    HX CHOLECYSTECTOMY  02/20/2022    HX PARTIAL THYROIDECTOMY Right 2018    No chemo/radiation; dx thyroid  cancer    HX PARTIAL THYROIDECTOMY Left 2020    no chemo/radiation    HX TONSILLECTOMY  1981   [4]   Family History  Problem Relation Name Age of Onset    No Known Problems Mother      Diabetes type II Father      Hypertension (High Blood Pressure) Father      Elevated Lipids Father      No Known Problems Sister      No Known Problems Brother      No Known Problems Half-Sister      No Known Problems Half-Brother      No Known Problems Maternal Aunt      No Known Problems Maternal Uncle      No Known Problems Paternal Aunt      No Known Problems Paternal Uncle      No Known Problems Maternal Grandmother      No Known Problems Maternal Grandfather      No Known Problems Paternal Grandmother      No Known Problems Paternal Grandfather      No Known Problems Daughter      No Known Problems  Son

## 2023-10-17 NOTE — Patient Instructions (Signed)
 Take medications as prescribed, avoid drugs and alcohol, call office if symptoms worsen or problems arise.  [phone].

## 2023-11-06 ENCOUNTER — Ambulatory Visit (HOSPITAL_PSYCHIATRIC): Payer: Self-pay | Admitting: PHYSICIAN ASSISTANT

## 2023-11-10 ENCOUNTER — Ambulatory Visit (HOSPITAL_PSYCHIATRIC): Payer: Self-pay | Admitting: COUNSELOR-PROFESSIONAL

## 2023-11-28 ENCOUNTER — Ambulatory Visit: Payer: Self-pay | Admitting: PHYSICIAN ASSISTANT

## 2023-11-29 ENCOUNTER — Other Ambulatory Visit (RURAL_HEALTH_CENTER): Payer: Self-pay | Admitting: Family

## 2023-12-08 ENCOUNTER — Ambulatory Visit (RURAL_HEALTH_CENTER): Payer: Self-pay | Attending: Family | Admitting: Family

## 2023-12-08 ENCOUNTER — Other Ambulatory Visit: Payer: Self-pay

## 2023-12-08 ENCOUNTER — Encounter (RURAL_HEALTH_CENTER): Payer: Self-pay | Admitting: Family

## 2023-12-08 VITALS — BP 134/76 | HR 70 | Temp 97.8°F | Wt 287.0 lb

## 2023-12-08 DIAGNOSIS — Z79899 Other long term (current) drug therapy: Secondary | ICD-10-CM | POA: Insufficient documentation

## 2023-12-08 DIAGNOSIS — F17210 Nicotine dependence, cigarettes, uncomplicated: Secondary | ICD-10-CM | POA: Insufficient documentation

## 2023-12-08 DIAGNOSIS — J3089 Other allergic rhinitis: Secondary | ICD-10-CM | POA: Insufficient documentation

## 2023-12-08 DIAGNOSIS — R42 Dizziness and giddiness: Secondary | ICD-10-CM | POA: Insufficient documentation

## 2023-12-08 DIAGNOSIS — Z6841 Body Mass Index (BMI) 40.0 and over, adult: Secondary | ICD-10-CM | POA: Insufficient documentation

## 2023-12-08 MED ORDER — DEXAMETHASONE SODIUM PHOSPHATE 4 MG/ML INJECTION SOLUTION
4.0000 mg | INTRAMUSCULAR | Status: AC
Start: 2023-12-08 — End: 2023-12-08
  Administered 2023-12-08: 4 mg via INTRAMUSCULAR

## 2023-12-08 NOTE — Assessment & Plan Note (Signed)
 Failed therapy on Chantix , prescribed by Dr Denver Flaming, [facility].  Patient reports she is not interested in smoking cessation at time.

## 2023-12-08 NOTE — Nursing Note (Signed)
 Patient here for ED follow up for dizziness, increased blood pressure, heart flutters, and increased anxiety

## 2023-12-08 NOTE — Assessment & Plan Note (Signed)
 Advised to hydrate well.  Advised follow up in 2 weeks to see if symptoms have completely resolved with antihistamines.  Return to clinic sooner if needed.

## 2023-12-08 NOTE — Addendum Note (Signed)
 Addended by: Cici Rodriges [place] on: 12/08/2023 01:21 PM     Modules accepted: Orders

## 2023-12-08 NOTE — Progress Notes (Signed)
 [facility] MEDICINE [facility]  [facility] FAM[facility] MEDICINE      Lindsey Daugherty  MRN: [id number]  DOB: Apr 10, 1976  Date of Service: 12/08/2023    CHIEF COMPLAINT  Chief Complaint   Patient presents with    ED Follow-up       SUBJECTIVE  Lindsey Daugherty is a 48 y.o. female who presents to clinic for emergency room follow up.  Patient was seen at [facility] ER ED on 12/07/2023.SABRA    Patient presented with hx of depression, anxiety, panic attacks, and insomnia who presents to the ED for lightheadedness, dizziness, feeling faint, chest tightness, and chest flutter. The patient reports that Friday evening, she just started to feel bad complaining of symptoms of dizziness, near fainting, and a feeling as if she was going to hit the floor. She felt like the ...world was in slow motion. Symptoms are worse when she turns her head, but not reproducible with specific movements. Starting last night, the patient started to experience sporadic chest tightness and palpitations. Patient reports she came to the ED because her boyfriend recently died of a heart attack and she has been concerned about any cardiac symptoms. She also noted her left ear is stopped up, which has resulted in similar dizziness symptoms. No diarrhea, vomiting, or nausea.     Patient had normal EKG and labs.  Patient discharged home stableon 12/07/2023          Review of Systems:  Positive ROS discussed in HPI, otherwise all other systems negative.      Medications:   ascorbic acid (SUPER C PO), Take 1 Tablet by mouth Once a day Super c with zinc  Cholecalciferol, Vitamin D3, (VITAMIN D -3) 50 mcg (2,000 unit) Oral Capsule, Take 1 Capsule by mouth Daily  citalopram  (CELEXA ) 40 mg Oral Tablet, Take 1 Tablet (40 mg total) by mouth Daily for 180 days  drospirenone, contraceptive, (SLYND) 4 mg (28) Oral Tablet, Take 1 Tablet (4 mg total) by mouth Daily  gabapentin (NEURONTIN) 300 mg Oral Capsule, Take 1 Capsule (300 mg total) by mouth  Daily  Ibuprofen  (MOTRIN ) 800 mg Oral Tablet, Take 1 Tablet (800 mg total) by mouth Three times a day as needed for Pain  levothyroxine  (SYNTHROID) 200 mcg Oral Tablet, Take 1 Tablet (200 mcg total) by mouth Every morning for 90 days  LORazepam  (ATIVAN ) 1 mg Oral Tablet, Take 1 Tablet (1 mg total) by mouth Three times a day  lovastatin  (MEVACOR ) 20 mg Oral Tablet, Take 1 Tablet (20 mg total) by mouth Every evening with dinner for 90 days  prenatal 21/iron fu/folic acid (PRENATAL COMPLETE ORAL), Take 1 Tablet by mouth Once a day  valACYclovir (VALTREX) 500 mg Oral Tablet, Take 1 Tablet (500 mg total) by mouth Daily  zolpidem  (AMBIEN ) 10 mg Oral Tablet, Take 1 Tablet (10 mg total) by mouth Every night  ARIPiprazole  (ABILIFY ) 5 mg Oral Tablet, Take 1 Tablet (5 mg total) by mouth Daily (Patient not taking: Reported on 12/08/2023)  hydroCHLOROthiazide  (MICROZIDE ) 12.5 mg Oral Capsule, TAKE 1 CAPSULE by mouth DAILY (Patient not taking: Reported on 12/08/2023)  prazosin  (MINIPRESS ) 1 mg Oral Capsule, Take 1 Capsule (1 mg total) by mouth Every evening (Patient not taking: Reported on 12/08/2023)  vitamin B complex (B COMPLEX-VITAMIN B12) Oral Tablet, Take 1 Tablet by mouth Daily (Patient not taking: Reported on 12/08/2023)    No facility-administered medications prior to visit.      Allergies:   Allergies[1]  OBJECTIVE  BP 134/76 (Site: Right Arm, Patient Position: Sitting, Cuff Size: Adult Large)   Pulse 70   Temp 36.6 C (97.8 F)   Wt 130 kg (287 lb)   SpO2 100%   BMI 47.76 kg/m       Physical Exam  Vitals reviewed.   Constitutional:       Appearance: She is obese.   HENT:      Right Ear: Tympanic membrane is bulging. Tympanic membrane is not retracted.      Left Ear: Tympanic membrane is bulging. Tympanic membrane is not retracted.      Nose: Mucosal edema and rhinorrhea present. No nasal tenderness. Rhinorrhea is clear.      Right Turbinates: Enlarged, swollen and pale.      Left Turbinates: Enlarged, swollen and  pale.      Right Sinus: No maxillary sinus tenderness or frontal sinus tenderness.      Left Sinus: No maxillary sinus tenderness or frontal sinus tenderness.      Mouth/Throat:      Lips: Pink.      Mouth: Mucous membranes are moist.      Pharynx: Postnasal drip present.   Eyes:      General: Lids are normal. Lids are everted, no foreign bodies appreciated. Vision grossly intact. Gaze aligned appropriately.      Extraocular Movements: Extraocular movements intact.   Neck:      Trachea: Trachea and phonation normal.   Lymphadenopathy:      Cervical: No cervical adenopathy.      Right cervical: No superficial cervical adenopathy.     Left cervical: No superficial cervical adenopathy.   Neurological:      Motor: Motor function is intact.      Coordination: Coordination is intact.   Psychiatric:         Speech: Speech normal.         Behavior: Behavior is cooperative.         Cognition and Memory: Cognition normal.           ASSESSMENT/PLAN  (J30.89) Environmental and seasonal allergies  (primary encounter diagnosis)    (R42) Dizziness    (F17.210) Tobacco dependence due to cigarettes       Problem List Items Addressed This Visit          Neurologic    Dizziness    Advised to hydrate well.  Advised follow up in 2 weeks to see if symptoms have completely resolved with antihistamines.  Return to clinic sooner if needed.            Psychiatric    Tobacco dependence due to cigarettes    Failed therapy on Chantix , prescribed by Dr Leron, [facility].  Patient reports she is not interested in smoking cessation at time.            Other    Environmental and seasonal allergies - Primary    Dexamethasone  10 mg IM given today.  Advise to continue Zyrtec  10 mg daily and fluticasone  nasal spray, 1 spray each nostril daily.  Hydrate well.  Advised over-the-counter Tylenol/Motrin  as needed.  Advised follow-up if symptoms progress or do not resolve.           No orders of the defined types were placed in this encounter.        Return in about 2 weeks (around 12/22/2023) for routine visit.    Donny LITTIE Clonts, NP-C 12/08/2023, 12:53           [  1]   Allergies  Allergen Reactions    Loratadine Shortness of Breath and  Other Adverse Reaction (Add comment)     claritan D    Pseudoephedrine Shortness of Breath    Sulfamethoxazole-Trimethoprim Itching and Hives/ Urticaria    Vitamin E Rash    Vitamin E-Safflower Oil     Vitamin E (D-Alpha Tocopherol) Itching

## 2023-12-08 NOTE — Assessment & Plan Note (Signed)
 Dexamethasone  10 mg IM given today.  Advise to continue Zyrtec  10 mg daily and fluticasone  nasal spray, 1 spray each nostril daily.  Hydrate well.  Advised over-the-counter Tylenol/Motrin  as needed.  Advised follow-up if symptoms progress or do not resolve.

## 2023-12-10 ENCOUNTER — Encounter (HOSPITAL_PSYCHIATRIC): Payer: Self-pay | Admitting: PHYSICIAN ASSISTANT

## 2023-12-10 ENCOUNTER — Other Ambulatory Visit: Payer: Self-pay

## 2023-12-10 ENCOUNTER — Ambulatory Visit: Attending: PHYSICIAN ASSISTANT | Admitting: PHYSICIAN ASSISTANT

## 2023-12-10 VITALS — BP 123/77 | HR 72 | Resp 18 | Ht 65.0 in | Wt 287.0 lb

## 2023-12-10 DIAGNOSIS — Z634 Disappearance and death of family member: Secondary | ICD-10-CM | POA: Insufficient documentation

## 2023-12-10 DIAGNOSIS — F411 Generalized anxiety disorder: Secondary | ICD-10-CM | POA: Insufficient documentation

## 2023-12-10 DIAGNOSIS — F5104 Psychophysiologic insomnia: Secondary | ICD-10-CM | POA: Insufficient documentation

## 2023-12-10 DIAGNOSIS — F410 Panic disorder [episodic paroxysmal anxiety] without agoraphobia: Secondary | ICD-10-CM | POA: Insufficient documentation

## 2023-12-10 DIAGNOSIS — F4310 Post-traumatic stress disorder, unspecified: Secondary | ICD-10-CM | POA: Insufficient documentation

## 2023-12-10 DIAGNOSIS — Z79899 Other long term (current) drug therapy: Secondary | ICD-10-CM

## 2023-12-10 DIAGNOSIS — F329 Major depressive disorder, single episode, unspecified: Secondary | ICD-10-CM | POA: Insufficient documentation

## 2023-12-10 MED ORDER — ZOLPIDEM 10 MG TABLET
10.0000 mg | ORAL_TABLET | Freq: Every evening | ORAL | 1 refills | Status: DC
Start: 2023-12-10 — End: 2024-01-14

## 2023-12-10 MED ORDER — LORAZEPAM 1 MG TABLET
ORAL_TABLET | ORAL | 1 refills | Status: DC
Start: 2023-12-10 — End: 2024-01-14

## 2023-12-10 MED ORDER — PRAZOSIN 1 MG CAPSULE
1.0000 mg | ORAL_CAPSULE | Freq: Every evening | ORAL | 1 refills | Status: DC
Start: 2023-12-10 — End: 2024-01-14

## 2023-12-10 MED ORDER — CITALOPRAM 40 MG TABLET
40.0000 mg | ORAL_TABLET | Freq: Every day | ORAL | 1 refills | Status: DC
Start: 2023-12-10 — End: 2024-01-14

## 2023-12-10 MED ORDER — ARIPIPRAZOLE 5 MG TABLET
5.0000 mg | ORAL_TABLET | Freq: Every day | ORAL | 1 refills | Status: DC
Start: 2023-12-10 — End: 2024-01-14

## 2023-12-10 NOTE — Patient Instructions (Signed)
 Take medications as prescribed, avoid drugs and alcohol, call office if symptoms worsen or problems arise.  [phone].

## 2023-12-10 NOTE — Progress Notes (Signed)
 BEHAVIORAL MEDICINE, [facility]  [address]  [place] [address]  Operated by [facility]  Progress Note    Name: Lindsey Daugherty MRN:  [id number]   Date: 12/10/2023 DOB:  [birth year] (48 y.o.)           Chief Complaint: Major Depression, Generalized Anxiety, PTSD, Insomnia, Panic Disorder, and Bereavement.    Subjective:   Patient reports that she has been having a rough time. In the past couple of weeks, she has had four severe panic attacks. She was painting her shutters and that is what her fiance used to do forher. Sunday was so bad that she had to go to the hospital. She felt that her heart was skipping. They checked her heart and did a neurological exam and everything came back normal. She did have some fluid in her ears, so they thought it could be that. The Friday before she had been helping her church handing out food boxes for the kids in the neighborhood. She was fine and in good spirits. She was driving home and it hit her really hard and fast. She had to pull over and let her friend drive the rest of the way. She tried to go into work, but couldn't do it. She had to miss five days last week due to her symptoms being exacerbated. She is not sure the trigger, even this morning, she is feeling very anxious. Every year they have a pig roast for the community after the [employer] vs. [employer] football game. Her fiance was a very big part of that and fall was a time that he loved along with the game of football, especially at the high school level. She is going to be involved and will be putting up a canopy and table, with a banner in loving memory of  him this year. There is new training at work. She cannot get past the bereavement, because she is constantly reminded of him. Sunday night, her best friend since the age of 22, who is also her pastor invited her over for a cookout Sunday night and it did help alleviate some of the anxiety. She is  uncertain what to do in those situations. She usually just lays down, because she is afraid of hitting the ground due to dizziness. She feels emotionally drained and will often nap after an episode for about an hour or so.   Mood: anxious.   Medications Patient has been afraid to take the medications. Encouraged her to take at first sign of a panic attack.  Appetite: She is eating fair with an average appetite.  Sleep: She sleeps, but it is not restful. Patient to check PCP with Sleep Study when she sees her in two weeks.  Energy: Poor with amotivation.  Stressors: Recent loss.     Problem List[1]  Past Medical History[2]  Past Surgical History[3]  Family History[4]  Social History     Socioeconomic History    Marital status: Divorced    Number of children: 0    Highest education level: Associate degree: academic program   Occupational History    Occupation: Pharmaceutical   Tobacco Use    Smoking status: Every Day     Current packs/day: 0.50     Average packs/day: 0.5 packs/day for 30.6 years (15.3 ttl pk-yrs)     Types: Cigarettes     Start date: 1995    Smokeless tobacco: Never    Tobacco comments:  Declines cessation   Vaping Use    Vaping status: Never Used   Substance and Sexual Activity    Alcohol use: Never    Drug use: Never    Sexual activity: Not Currently     Partners: Male     Comment: LMP: 12/04/21 ON ORAL CONTRACEPTION; G2P0A2     Social Determinants of Health     Financial Resource Strain: Low Risk  (11/25/2022)    Financial Resource Strain     SDOH Financial: No   Transportation Needs: Low Risk  (11/25/2022)    Transportation Needs     SDOH Transportation: No   Social Connections: Low Risk  (11/25/2022)    Social Connections     SDOH Social Isolation: 5 or more times a week   Intimate Partner Violence: Low Risk  (11/25/2022)    Intimate Partner Violence     SDOH Domestic Violence: No   Housing Stability: Low Risk  (11/25/2022)    Housing Stability     SDOH Housing Situation: I have housing.     SDOH  Housing Worry: No      Loratadine, Pseudoephedrine, Sulfamethoxazole-trimethoprim, Vitamin e, Vitamin e-safflower oil, and Vitamin e (d-alpha tocopherol)   Current Outpatient Medications   Medication Sig    ARIPiprazole  (ABILIFY ) 5 mg Oral Tablet Take 1 Tablet (5 mg total) by mouth Daily    ascorbic acid (SUPER C PO) Take 1 Tablet by mouth Once a day Super c with zinc    Cholecalciferol, Vitamin D3, (VITAMIN D -3) 50 mcg (2,000 unit) Oral Capsule Take 1 Capsule by mouth Daily    citalopram  (CELEXA ) 40 mg Oral Tablet Take 1 Tablet (40 mg total) by mouth Daily for 180 days    drospirenone, contraceptive, (SLYND) 4 mg (28) Oral Tablet Take 1 Tablet (4 mg total) by mouth Daily    gabapentin (NEURONTIN) 300 mg Oral Capsule Take 1 Capsule (300 mg total) by mouth Daily    Ibuprofen  (MOTRIN ) 800 mg Oral Tablet Take 1 Tablet (800 mg total) by mouth Three times a day as needed for Pain    levothyroxine  (SYNTHROID) 200 mcg Oral Tablet Take 1 Tablet (200 mcg total) by mouth Every morning for 90 days    LORazepam  (ATIVAN ) 1 mg Oral Tablet Take 1 Tablet (1 mg total) by mouth Three times a day    lovastatin  (MEVACOR ) 20 mg Oral Tablet Take 1 Tablet (20 mg total) by mouth Every evening with dinner for 90 days    prenatal 21/iron fu/folic acid (PRENATAL COMPLETE ORAL) Take 1 Tablet by mouth Once a day (Patient not taking: Reported on 12/10/2023)    valACYclovir (VALTREX) 500 mg Oral Tablet Take 1 Tablet (500 mg total) by mouth Daily    zolpidem  (AMBIEN ) 10 mg Oral Tablet Take 1 Tablet (10 mg total) by mouth Every night      Objective :  BP 123/77 (Site: Left Arm, Patient Position: Sitting)   Pulse 72   Resp 18   Ht 1.651 m (5' 5)   Wt 130 kg (287 lb)   BMI 47.76 kg/m       PHQ Total Score  PHQ 2 Total: 6  PHQ 9 Total: 27  Interpretation of Total Score: 20-27 Severe depression             09/01/2023     8:45 AM 10/17/2023     8:58 AM 12/10/2023    10:18 AM   Most Recent PHQ-9 Scores   PHQ 9 Total 24 22  27        Mental Status  Exam  AXOX4. Casual dress, calm, well-groomed. No SI, HI, AVH, delusions, or paranoia. Thoughts are logical, coherent, and goal-directed. Good eye contact. Speech is normal in rate and tone. Mood is anxious affect congruent. No psychomotor agitation or retardation, cogwheel rigidity, or abnormal movements. Gait is normal. Attention, concentration, and memory are good. No cognitive deficits noted. Judgment and insight are fair. Calculation and abstraction are within normal limits.     Data Reviewed  I have reviewed patient's previous note medical, surgical, family, and social history in detail today,     Assessment  Major Depression, Generalized Anxiety, PTSD, Insomnia, Panic Disorder, and Bereavement.    Plan  Continue the following medications:  Celexa  40 mg daily for mood.  Abilify  5 mg daily for mood.  Ativan  1 mg daily and then bid/prn for anxiety.  Ambien  10 mg qhs/prn for sleep.   Prazosin  1 mg qhs/prn for sleep.  Fill out internal referral for therapy.  Patient to send in FMLA paperwork and re-evaluate in six months.    Follow up  Return to clinic in 4 weeks.    Adrien Herd, PA-C        [1]   Patient Active Problem List  Diagnosis    Acquired hypothyroidism    Tobacco dependence due to cigarettes    Gastroesophageal reflux disease without esophagitis    Insomnia    Vitamin D  deficiency    B12 deficiency    Morbid obesity with BMI of 45.0-49.9, adult (CMS HCC)    Hypertriglyceridemia    Bereavement    Environmental and seasonal allergies    Major depression    Generalized anxiety disorder with panic attacks    Panic disorder without agoraphobia    Mild sleep apnea    Normal gynecologic examination    Nontoxic single thyroid  nodule    Noninflammatory disorder of vagina    Non-toxic multinodular goiter    Need for immunization against influenza    Menorrhagia    Dizziness    Benign neoplasm of thyroid  gland    Back pain    Chronic joint pain    Iron deficiency anemia    Abnormal findings on diagnostic imaging  of other specified body structures    Weight loss advised    Encounter for weight management    PTSD (post-traumatic stress disorder)    Encounter for adult wellness visit    Diabetes mellitus screening    Colon cancer screening   [2]   Past Medical History:  Diagnosis Date    Acute vaginitis 08/23/2022    Bereavement 05/08/2022    Dermatitis, unspecified 08/23/2022    Dizziness and giddiness 08/23/2022    Dysmenorrhea 08/23/2022    Edema of lower extremity 07/24/2021    Esophageal reflux     Family history of cardiovascular disease 08/23/2022    Family history of malignant neoplasm of digestive organ 08/23/2022    Insomnia     Lightheadedness 08/23/2022    Migraine     Snoring 08/23/2022    Thyroid  cancer (CMS HCC) 2018    Vaginal odor 08/23/2022   [3]   Past Surgical History:  Procedure Laterality Date    HX CHOLECYSTECTOMY  02/20/2022    HX PARTIAL THYROIDECTOMY Right 2018    No chemo/radiation; dx thyroid  cancer    HX PARTIAL THYROIDECTOMY Left 2020    no chemo/radiation    HX TONSILLECTOMY  1981   [4]  Family History  Problem Relation Name Age of Onset    No Known Problems Mother      Diabetes type II Father      Hypertension (High Blood Pressure) Father      Elevated Lipids Father      No Known Problems Sister      No Known Problems Brother      No Known Problems Half-Sister      No Known Problems Half-Brother      No Known Problems Maternal Aunt      No Known Problems Maternal Uncle      No Known Problems Paternal Aunt      No Known Problems Paternal Uncle      No Known Problems Maternal Grandmother      No Known Problems Maternal Grandfather      No Known Problems Paternal Grandmother      No Known Problems Paternal Grandfather      No Known Problems Daughter      No Known Problems Son

## 2023-12-24 ENCOUNTER — Ambulatory Visit (RURAL_HEALTH_CENTER): Payer: Self-pay | Admitting: Family

## 2023-12-25 ENCOUNTER — Other Ambulatory Visit (RURAL_HEALTH_CENTER): Payer: Self-pay | Admitting: Family

## 2023-12-30 ENCOUNTER — Other Ambulatory Visit: Payer: Self-pay

## 2023-12-31 ENCOUNTER — Ambulatory Visit (RURAL_HEALTH_CENTER): Payer: Self-pay | Attending: Family | Admitting: Family

## 2023-12-31 ENCOUNTER — Encounter (RURAL_HEALTH_CENTER): Payer: Self-pay | Admitting: Family

## 2023-12-31 ENCOUNTER — Ambulatory Visit: Attending: Family | Admitting: Family

## 2023-12-31 VITALS — BP 122/74 | HR 78 | Temp 98.6°F | Wt 288.0 lb

## 2023-12-31 DIAGNOSIS — E538 Deficiency of other specified B group vitamins: Secondary | ICD-10-CM | POA: Insufficient documentation

## 2023-12-31 DIAGNOSIS — Z79890 Hormone replacement therapy (postmenopausal): Secondary | ICD-10-CM | POA: Insufficient documentation

## 2023-12-31 DIAGNOSIS — Z79899 Other long term (current) drug therapy: Secondary | ICD-10-CM | POA: Insufficient documentation

## 2023-12-31 DIAGNOSIS — H6691 Otitis media, unspecified, right ear: Secondary | ICD-10-CM | POA: Insufficient documentation

## 2023-12-31 DIAGNOSIS — G4730 Sleep apnea, unspecified: Secondary | ICD-10-CM | POA: Insufficient documentation

## 2023-12-31 DIAGNOSIS — E559 Vitamin D deficiency, unspecified: Secondary | ICD-10-CM | POA: Insufficient documentation

## 2023-12-31 DIAGNOSIS — D509 Iron deficiency anemia, unspecified: Secondary | ICD-10-CM | POA: Insufficient documentation

## 2023-12-31 DIAGNOSIS — K219 Gastro-esophageal reflux disease without esophagitis: Secondary | ICD-10-CM | POA: Insufficient documentation

## 2023-12-31 DIAGNOSIS — G4700 Insomnia, unspecified: Secondary | ICD-10-CM | POA: Insufficient documentation

## 2023-12-31 DIAGNOSIS — E039 Hypothyroidism, unspecified: Secondary | ICD-10-CM | POA: Insufficient documentation

## 2023-12-31 DIAGNOSIS — Z6841 Body Mass Index (BMI) 40.0 and over, adult: Secondary | ICD-10-CM | POA: Insufficient documentation

## 2023-12-31 DIAGNOSIS — F17210 Nicotine dependence, cigarettes, uncomplicated: Secondary | ICD-10-CM | POA: Insufficient documentation

## 2023-12-31 DIAGNOSIS — E781 Pure hyperglyceridemia: Secondary | ICD-10-CM | POA: Insufficient documentation

## 2023-12-31 LAB — THYROID STIMULATING HORMONE WITH FREE T4 REFLEX: TSH: 1.7 u[IU]/mL (ref 0.450–5.330)

## 2023-12-31 LAB — LIPID PANEL
CHOL/HDL RATIO: 4.2
CHOLESTEROL: 142 mg/dL (ref <200)
HDL CHOL: 34 mg/dL — ABNORMAL LOW (ref >=40)
LDL CALC: 55 mg/dL (ref 0–100)
TRIGLYCERIDES: 264 mg/dL — ABNORMAL HIGH (ref <=150)
VLDL CALC: 53 mg/dL — ABNORMAL HIGH (ref 0–50)

## 2023-12-31 LAB — COMPREHENSIVE METABOLIC PNL, FASTING
ALBUMIN/GLOBULIN RATIO: 1.2 (ref 0.8–1.4)
ALBUMIN: 4.1 g/dL (ref 3.5–5.7)
ALKALINE PHOSPHATASE: 42 U/L (ref 34–104)
ALT (SGPT): 13 U/L (ref 7–52)
ANION GAP: 6 mmol/L (ref 4–13)
AST (SGOT): 16 U/L (ref 13–39)
BILIRUBIN TOTAL: 0.4 mg/dL (ref 0.3–1.0)
BUN/CREA RATIO: 12 (ref 6–22)
BUN: 10 mg/dL (ref 7–25)
CALCIUM, CORRECTED: 9.1 mg/dL (ref 8.9–10.8)
CALCIUM: 9.2 mg/dL (ref 8.6–10.3)
CHLORIDE: 105 mmol/L (ref 98–107)
CO2 TOTAL: 27 mmol/L (ref 21–31)
CREATININE: 0.84 mg/dL (ref 0.60–1.30)
ESTIMATED GFR: 85 mL/min/1.73mˆ2 (ref >59)
GLOBULIN: 3.3 (ref 2.0–3.5)
GLUCOSE: 104 mg/dL (ref 74–109)
OSMOLALITY, CALCULATED: 275 mosm/kg (ref 270–290)
POTASSIUM: 4.2 mmol/L (ref 3.5–5.1)
PROTEIN TOTAL: 7.4 g/dL (ref 6.4–8.9)
SODIUM: 138 mmol/L (ref 136–145)

## 2023-12-31 LAB — CBC
HCT: 42.6 % — ABNORMAL HIGH (ref 31.2–41.9)
HGB: 15 g/dL — ABNORMAL HIGH (ref 10.9–14.3)
MCH: 30 pg (ref 24.7–32.8)
MCHC: 35.3 g/dL (ref 32.3–35.6)
MCV: 85 fL (ref 75.5–95.3)
MPV: 9.7 fL (ref 7.9–10.8)
PLATELETS: 221 x10ˆ3/uL (ref 140–440)
RBC: 5.02 x10ˆ6/uL — ABNORMAL HIGH (ref 3.63–4.92)
RDW: 12.7 % (ref 12.3–17.7)
WBC: 8.8 x10ˆ3/uL (ref 3.8–11.8)

## 2023-12-31 LAB — MAGNESIUM: MAGNESIUM: 2.1 mg/dL (ref 1.9–2.7)

## 2023-12-31 LAB — IRON TRANSFERRIN AND TIBC
IRON (TRANSFERRIN) SATURATION: 13 % — ABNORMAL LOW (ref 15–50)
IRON: 47 ug/dL — ABNORMAL LOW (ref 50–212)
TOTAL IRON BINDING CAPACITY: 354 ug/dL (ref 250–450)
TRANSFERRIN: 253 mg/dL (ref 203–362)
UIBC: 307 ug/dL (ref 130–375)

## 2023-12-31 LAB — VITAMIN D 25 TOTAL: VITAMIN D 25, TOTAL: 46.91 ng/mL (ref 30.00–100.00)

## 2023-12-31 LAB — VITAMIN B12: VITAMIN B 12: 319 pg/mL (ref 180–914)

## 2023-12-31 LAB — FERRITIN: FERRITIN: 44 ng/mL (ref 11–307)

## 2023-12-31 MED ORDER — AMOXICILLIN 500 MG-POTASSIUM CLAVULANATE 125 MG TABLET
1.0000 | ORAL_TABLET | Freq: Two times a day (BID) | ORAL | 0 refills | Status: AC
Start: 2023-12-31 — End: 2024-01-14

## 2023-12-31 MED ORDER — LOVASTATIN 20 MG TABLET
20.0000 mg | ORAL_TABLET | Freq: Every evening | ORAL | 1 refills | Status: AC
Start: 2023-12-31 — End: ?

## 2023-12-31 NOTE — Progress Notes (Unsigned)
 [facility]  FAMILY MEDICINE, [facility] FAMILY MEDICINE [facility]  [address]  [place] [address]  Operated by [facility]     Name: Lindsey Daugherty MRN:  [id number]   Date of Birth: 07/16/1975 Age: 48 y.o.   Date: 12/31/2023  Time: 10:12     Provider: Donny LITTIE Clonts, NP-C    Reason for visit: Follow Up 3 Months      History of Present Illness:  Lindsey Daugherty is a 48 y.o. female presenting with chronic disease management.      Patient reports she is under the care of Psychiatry.  Reports she has decreased taking her Ativan  1mg  daily due to feeling fatigued.  Reports she is not sleeping well at night because she is sleeping during the day.  Reports she is also taking Ambien  to sleep.      Patient Active Problem List    Diagnosis Date Noted    Right otitis media 12/31/2023    Encounter for adult wellness visit 09/30/2023    Diabetes mellitus screening 09/30/2023    Colon cancer screening 09/30/2023    PTSD (post-traumatic stress disorder) 06/13/2023    Panic disorder without agoraphobia 08/23/2022    Mild sleep apnea 08/23/2022    Normal gynecologic examination 08/23/2022    Nontoxic single thyroid  nodule 08/23/2022    Noninflammatory disorder of vagina 08/23/2022    Non-toxic multinodular goiter 08/23/2022    Need for immunization against influenza 08/23/2022    Menorrhagia 08/23/2022     [name], GYN      Benign neoplasm of thyroid  gland 08/23/2022    Back pain 08/23/2022    Chronic joint pain 08/23/2022    Iron deficiency anemia 08/23/2022    Abnormal findings on diagnostic imaging of other specified body structures 08/23/2022    Weight loss advised 08/23/2022    Encounter for weight management 08/23/2022    Major depression 06/05/2022    Generalized anxiety disorder with panic attacks 06/05/2022     Managed by [facility]      Bereavement 05/08/2022    Environmental and seasonal allergies 05/08/2022    Hypertriglyceridemia 03/25/2022    Acquired hypothyroidism  09/19/2021    Tobacco dependence due to cigarettes 09/19/2021    Gastroesophageal reflux disease without esophagitis 09/19/2021    Vitamin D  deficiency 09/19/2021    B12 deficiency 09/19/2021    Morbid obesity with BMI of 45.0-49.9, adult (CMS HCC) 09/19/2021    Insomnia 06/11/2021     Managed by [facility]         Historical Data    Past Medical History:  Past Medical History:   Diagnosis Date    Acute vaginitis 08/23/2022    Bereavement 05/08/2022    Dermatitis, unspecified 08/23/2022    Dizziness 08/23/2022    Dizziness and giddiness 08/23/2022    Dysmenorrhea 08/23/2022    Edema of lower extremity 07/24/2021    Esophageal reflux     Family history of cardiovascular disease 08/23/2022    Family history of malignant neoplasm of digestive organ 08/23/2022    Insomnia     Lightheadedness 08/23/2022    Migraine     Snoring 08/23/2022    Thyroid  cancer (CMS HCC) 2018    Vaginal odor 08/23/2022     Past Surgical History:  Past Surgical History:   Procedure Laterality Date    HX CHOLECYSTECTOMY  02/20/2022    HX PARTIAL THYROIDECTOMY Right 2018    No chemo/radiation; dx thyroid   cancer    HX PARTIAL THYROIDECTOMY Left 2020    no chemo/radiation    HX TONSILLECTOMY  1981     Allergies:  Allergies   Allergen Reactions    Loratadine Shortness of Breath and  Other Adverse Reaction (Add comment)     claritan D    Pseudoephedrine Shortness of Breath    Sulfamethoxazole-Trimethoprim Itching and Hives/ Urticaria    Vitamin E Rash    Vitamin E-Safflower Oil     Vitamin E (D-Alpha Tocopherol) Itching     Medications:  Current Outpatient Medications   Medication Sig    amoxicillin -pot clavulanate (AUGMENTIN ) 500-125 mg Oral Tablet Take 1 Tablet by mouth Twice daily for 10 days    ARIPiprazole  (ABILIFY ) 5 mg Oral Tablet Take 1 Tablet (5 mg total) by mouth Daily    ascorbic acid (SUPER C PO) Take 1 Tablet by mouth Once a day Super c with zinc    Cholecalciferol, Vitamin D3, (VITAMIN D -3) 50 mcg (2,000 unit) Oral  Capsule Take 1 Capsule by mouth Daily    citalopram  (CELEXA ) 40 mg Oral Tablet Take 1 Tablet (40 mg total) by mouth Daily for 180 days    drospirenone, contraceptive, (SLYND) 4 mg (28) Oral Tablet Take 1 Tablet (4 mg total) by mouth Daily    gabapentin (NEURONTIN) 300 mg Oral Capsule Take 1 Capsule (300 mg total) by mouth Daily    Ibuprofen  (MOTRIN ) 800 mg Oral Tablet Take 1 Tablet (800 mg total) by mouth Three times a day as needed for Pain    levothyroxine  (SYNTHROID) 200 mcg Oral Tablet Take 1 Tablet (200 mcg total) by mouth Every morning for 90 days    LORazepam  (ATIVAN ) 1 mg Oral Tablet Take one tablet by mouth every morning and then twice daily as needed for anxiety/panic attacks.    lovastatin  (MEVACOR ) 20 mg Oral Tablet Take 1 Tablet (20 mg total) by mouth Every evening with dinner    prazosin  (MINIPRESS ) 1 mg Oral Capsule Take 1 Capsule (1 mg total) by mouth Every evening    prenatal 21/iron fu/folic acid (PRENATAL COMPLETE ORAL) Take 1 Tablet by mouth Once a day (Patient not taking: Reported on 12/10/2023)    valACYclovir (VALTREX) 500 mg Oral Tablet Take 1 Tablet (500 mg total) by mouth Daily    zolpidem  (AMBIEN ) 10 mg Oral Tablet Take 1 Tablet (10 mg total) by mouth Every night     Family History:  Family Medical History:       Problem Relation (Age of Onset)    Diabetes type II Father    Elevated Lipids Father    Hypertension (High Blood Pressure) Father    No Known Problems Mother, Sister, Brother, Half-Sister, Half-Brother, Maternal Aunt, Maternal Uncle, Paternal Aunt, Paternal Uncle, Maternal Grandmother, Maternal Grandfather, Paternal Grandmother, Paternal Grandfather, Daughter, Son            Social History:  Social History     Socioeconomic History    Marital status: Divorced    Number of children: 0    Highest education level: Associate degree: academic program   Occupational History    Occupation: Pharmaceutical   Tobacco Use    Smoking status: Every Day     Current packs/day: 0.50     Average  packs/day: 0.5 packs/day for 30.7 years (15.3 ttl pk-yrs)     Types: Cigarettes     Start date: 1995    Smokeless tobacco: Never    Tobacco comments:     Declines cessation  Vaping Use    Vaping status: Never Used   Substance and Sexual Activity    Alcohol use: Never    Drug use: Never    Sexual activity: Not Currently     Partners: Male     Comment: LMP: 12/04/21 ON ORAL CONTRACEPTION; G2P0A2     Social Determinants of Health     Financial Resource Strain: Low Risk (11/25/2022)    Financial Resource Strain     SDOH Financial: No   Transportation Needs: Low Risk (11/25/2022)    Transportation Needs     SDOH Transportation: No   Social Connections: Low Risk (11/25/2022)    Social Connections     SDOH Social Isolation: 5 or more times a week   Intimate Partner Violence: Low Risk (11/25/2022)    Intimate Partner Violence     SDOH Domestic Violence: No   Housing Stability: Low Risk (11/25/2022)    Housing Stability     SDOH Housing Situation: I have housing.     SDOH Housing Worry: No           Review of Systems:  Any pertinent Review of Systems as addressed in the HPI above.    Physical Exam:  Vital Signs:  Vitals:    12/31/23 1133   BP: 122/74   Pulse: 78   Temp: 37 C (98.6 F)   SpO2: 98%   Weight: 131 kg (288 lb)     Physical Exam  Vitals reviewed.   Constitutional:       Appearance: Normal appearance. She is obese.   HENT:      Head: Normocephalic.      Mouth/Throat:      Pharynx: Oropharynx is clear.   Eyes:      Extraocular Movements: Extraocular movements intact.      Conjunctiva/sclera: Conjunctivae normal.      Pupils: Pupils are equal, round, and reactive to light.   Neck:      Thyroid : No thyroid  mass, thyromegaly or thyroid  tenderness.   Cardiovascular:      Rate and Rhythm: Normal rate and regular rhythm.      Pulses: Normal pulses.      Heart sounds: Normal heart sounds, S1 normal and S2 normal.   Pulmonary:      Effort: Pulmonary effort is normal.      Breath sounds: Normal breath sounds.   Abdominal:       General: Bowel sounds are normal.      Palpations: Abdomen is soft.   Musculoskeletal:         General: Normal range of motion.      Cervical back: Normal range of motion and neck supple.      Right lower leg: No edema.      Left lower leg: No edema.   Skin:     General: Skin is warm and dry.   Neurological:      General: No focal deficit present.      Mental Status: She is alert and oriented to person, place, and time. Mental status is at baseline.      Cranial Nerves: Cranial nerves 2-12 are intact.      Motor: Motor function is intact.      Coordination: Coordination is intact.      Gait: Gait is intact.   Psychiatric:         Mood and Affect: Mood normal.         Behavior: Behavior normal. Behavior is cooperative.  Thought Content: Thought content normal.         Cognition and Memory: Cognition normal.         Judgment: Judgment normal.          Assessment/Plan:  Problem List Items Addressed This Visit          Cardiovascular System    Hypertriglyceridemia - Primary    Lab Results   Component Value Date    CHOLESTEROL 142 12/31/2023    HDLCHOL 34 (L) 12/31/2023    LDLCHOL 55 12/31/2023    TRIG 264 (H) 12/31/2023      Continue lovastatin  20 mg daily to lower triglycerides levels.  Advise to limit high fat foods, processed foods and fast foods in diet.  Discussed side effects.  Rechecked labs in 3 months.         Relevant Medications    lovastatin  (MEVACOR ) 20 mg Oral Tablet    Other Relevant Orders    CBC    COMPREHENSIVE METABOLIC PNL, FASTING    LIPID PANEL    MAGNESIUM    VITAMIN D  25 TOTAL    VITAMIN B12    FERRITIN (Completed)    IRON TRANSFERRIN AND TIBC (Completed)    THYROID  STIMULATING HORMONE WITH FREE T4 REFLEX       Respiratory    Mild sleep apnea    Referred [facility].   No CPAP recommended. Advise to continue daily nasal spray, strongly advsied smoking cessation and encourage 10% weight loss goal.  Advised regular schedule aerobic exercise and strength training.  Advised to sleep on  stomach or side. Avoid alcohol and sedatives.  Advised to set aside 8 hours each to sleep and discussed sleep training.          Relevant Orders    CBC    COMPREHENSIVE METABOLIC PNL, FASTING    LIPID PANEL    MAGNESIUM    VITAMIN D  25 TOTAL    VITAMIN B12    FERRITIN (Completed)    IRON TRANSFERRIN AND TIBC (Completed)    Referral to SLEEP DISORDERS - [place] [facility] [place]    THYROID  STIMULATING HORMONE WITH FREE T4 REFLEX       Neurologic    Insomnia    Discussed sleep hygiene.  Advise the importance of being on a sleep routine. Managed by [facility].         Relevant Orders    CBC    COMPREHENSIVE METABOLIC PNL, FASTING    LIPID PANEL    MAGNESIUM    VITAMIN D  25 TOTAL    VITAMIN B12    FERRITIN (Completed)    IRON TRANSFERRIN AND TIBC (Completed)    THYROID  STIMULATING HORMONE WITH FREE T4 REFLEX       Digestive    Gastroesophageal reflux disease without esophagitis    Lab Results   Component Value Date    AST 12 (L) 09/30/2023    ALT 9 09/30/2023    Symptoms well controlled with Protonix  40 mg daily.  Continue current medications.         Relevant Orders    CBC    COMPREHENSIVE METABOLIC PNL, FASTING    LIPID PANEL    MAGNESIUM    VITAMIN D  25 TOTAL    VITAMIN B12    FERRITIN (Completed)    IRON TRANSFERRIN AND TIBC (Completed)    THYROID  STIMULATING HORMONE WITH FREE T4 REFLEX       Endocrine    Acquired hypothyroidism  THYROID  STIMULATING HORMONE  Lab Results   Component Value Date    TSH 1.167 09/04/2023     Continue levothyroxine  200 mcg daily.  Patient reports missed doses x3 weeks.  Advised to take levothyroxine  in the morning on an empty stomach and wait 30 minutes prior to eating/drinking.  Discussed the importance of medication compliance.  Patient verbalized understanding.  Recheck TSH in 4 weeks.         Relevant Orders    CBC    COMPREHENSIVE METABOLIC PNL, FASTING    LIPID PANEL    MAGNESIUM    VITAMIN D  25 TOTAL    VITAMIN B12    FERRITIN (Completed)    IRON TRANSFERRIN AND TIBC  (Completed)    THYROID  STIMULATING HORMONE WITH FREE T4 REFLEX    Vitamin D  deficiency    Advised vitamin-D 4000 IU daily.         Relevant Orders    CBC    COMPREHENSIVE METABOLIC PNL, FASTING    LIPID PANEL    MAGNESIUM    VITAMIN D  25 TOTAL    VITAMIN B12    FERRITIN (Completed)    IRON TRANSFERRIN AND TIBC (Completed)    THYROID  STIMULATING HORMONE WITH FREE T4 REFLEX    B12 deficiency    VITAMIN B 12   Date Value Ref Range Status   12/31/2023 319 180 - 914 pg/mL Final    Continue vitamin B12 1000 mcg daily.         Relevant Orders    CBC    COMPREHENSIVE METABOLIC PNL, FASTING    LIPID PANEL    MAGNESIUM    VITAMIN D  25 TOTAL    VITAMIN B12    FERRITIN (Completed)    IRON TRANSFERRIN AND TIBC (Completed)    THYROID  STIMULATING HORMONE WITH FREE T4 REFLEX       Hematologic/Lymphatic    Iron deficiency anemia    Lab Results   Component Value Date/Time    IRONBINDCAP 354 12/31/2023 12:08 PM    IRONSAT 13 (L) 12/31/2023 12:08 PM   FERRITIN  Lab Results   Component Value Date    FERRITIN 44 12/31/2023      Further treatment pending labs.         Relevant Orders    CBC    COMPREHENSIVE METABOLIC PNL, FASTING    LIPID PANEL    MAGNESIUM    VITAMIN D  25 TOTAL    VITAMIN B12    FERRITIN (Completed)    IRON TRANSFERRIN AND TIBC (Completed)    THYROID  STIMULATING HORMONE WITH FREE T4 REFLEX       Ear, Nose, and Throat    Right otitis media    Start Augmentin  as prescribed for right otitis media.  Advise OTC Tylenol/Motrin  PRN. Continue allergy medications.  Advised to follow up if symptoms progress or do not resolve.          Relevant Orders    THYROID  STIMULATING HORMONE WITH FREE T4 REFLEX       Psychiatric    Tobacco dependence due to cigarettes    Relevant Orders    CBC    COMPREHENSIVE METABOLIC PNL, FASTING    LIPID PANEL    MAGNESIUM    VITAMIN D  25 TOTAL    VITAMIN B12    FERRITIN (Completed)    IRON TRANSFERRIN AND TIBC (Completed)    THYROID  STIMULATING HORMONE WITH FREE T4 REFLEX       Other    Morbid  obesity  with BMI of 45.0-49.9, adult (CMS HCC)    BMI 47. Weight: 131 kg (288 lb)  Advised a low-fat/low-sodium diet, advised 150 minutes of scheduled weekly physical activity as tolerated continue with weight loss efforts.           Relevant Orders    CBC    COMPREHENSIVE METABOLIC PNL, FASTING    LIPID PANEL    MAGNESIUM    VITAMIN D  25 TOTAL    VITAMIN B12    FERRITIN (Completed)    IRON TRANSFERRIN AND TIBC (Completed)    THYROID  STIMULATING HORMONE WITH FREE T4 REFLEX        Orders Placed This Encounter    CBC    COMPREHENSIVE METABOLIC PNL, FASTING    LIPID PANEL    MAGNESIUM    CANCELED: THYROID  STIMULATING HORMONE (SENSITIVE TSH)    VITAMIN D  25 TOTAL    VITAMIN B12    FERRITIN    IRON TRANSFERRIN AND TIBC    THYROID  STIMULATING HORMONE WITH FREE T4 REFLEX    Referral to SLEEP DISORDERS - [place] [facility] [place]    amoxicillin -pot clavulanate (AUGMENTIN ) 500-125 mg Oral Tablet    lovastatin  (MEVACOR ) 20 mg Oral Tablet      Tobacco cessation counseling performed.      Labs drawn today.  Continue current medications.Advised to follow up with specialists.  Advised a low-fat/low sodium diet, advised 150 minutes of scheduled weekly physical activity as tolerated.  Advised to maintain a healthy weight.     Return in about 3 months (around 03/31/2024) for routine visit.    Donny LITTIE Clonts, NP-C     Portions of this note may be dictated using voice recognition software or a dictation service. Variances in spelling and vocabulary are possible and unintentional. Not all errors are caught/corrected. Please notify the dino if any discrepancies are noted or if the meaning of any statement is not clear.

## 2023-12-31 NOTE — Assessment & Plan Note (Signed)
No CPAP recommended. Advise to continue daily nasal spray, strongly advsied smoking cessation and encourage 10% weight loss goal.  Advised regular schedule aerobic exercise and strength training.  Advised to sleep on stomach or side. Avoid alcohol and sedatives.  Advised to set aside 8 hours each to sleep and discussed sleep training.

## 2023-12-31 NOTE — Nursing Note (Signed)
 Patient here for follow up

## 2023-12-31 NOTE — Assessment & Plan Note (Signed)
 THYROID  STIMULATING HORMONE  Lab Results   Component Value Date    TSH 1.167 09/04/2023     Further treatment pending labs.  Continue levothyroxine  200 mcg daily.  Patient reports missed doses x3 weeks.  Advised to take levothyroxine  in the morning on an empty stomach and wait 30 minutes prior to eating/drinking.  Discussed the importance of medication compliance.  Patient verbalized understanding.  Recheck TSH in 4 weeks.

## 2023-12-31 NOTE — Assessment & Plan Note (Signed)
 Lab Results   Component Value Date    AST 12 (L) 09/30/2023    ALT 9 09/30/2023    Symptoms well controlled with Protonix  40 mg daily.  Continue current medications.

## 2023-12-31 NOTE — Assessment & Plan Note (Signed)
 Discussed sleep hygiene.  Advise the importance of being on a sleep routine. Managed by [facility].

## 2024-01-01 ENCOUNTER — Other Ambulatory Visit (RURAL_HEALTH_CENTER): Payer: Self-pay | Admitting: Family

## 2024-01-01 ENCOUNTER — Ambulatory Visit (RURAL_HEALTH_CENTER): Payer: Self-pay | Admitting: Family

## 2024-01-01 ENCOUNTER — Encounter (RURAL_HEALTH_CENTER): Payer: Self-pay | Admitting: Family

## 2024-01-01 MED ORDER — FLUCONAZOLE 150 MG TABLET
150.0000 mg | ORAL_TABLET | ORAL | 0 refills | Status: AC
Start: 2024-01-01 — End: 2024-01-14

## 2024-01-01 NOTE — Assessment & Plan Note (Signed)
 Start Augmentin  as prescribed for right otitis media.  Advise OTC Tylenol/Motrin  PRN. Continue allergy medications.  Advised to follow up if symptoms progress or do not resolve.

## 2024-01-01 NOTE — Assessment & Plan Note (Signed)
 Lab Results   Component Value Date/Time    IRONBINDCAP 354 12/31/2023 12:08 PM    IRONSAT 13 (L) 12/31/2023 12:08 PM   FERRITIN  Lab Results   Component Value Date    FERRITIN 44 12/31/2023      Further treatment pending labs.

## 2024-01-01 NOTE — Assessment & Plan Note (Signed)
Advised vitamin-D 4000 IU daily.

## 2024-01-01 NOTE — Assessment & Plan Note (Signed)
 Lab Results   Component Value Date    CHOLESTEROL 142 12/31/2023    HDLCHOL 34 (L) 12/31/2023    LDLCHOL 55 12/31/2023    TRIG 264 (H) 12/31/2023      Continue lovastatin  20 mg daily to lower triglycerides levels.  Advise to limit high fat foods, processed foods and fast foods in diet.  Discussed side effects.  Rechecked labs in 3 months.

## 2024-01-01 NOTE — Assessment & Plan Note (Signed)
 VITAMIN B 12   Date Value Ref Range Status   12/31/2023 319 180 - 914 pg/mL Final    Continue vitamin B12 1000 mcg daily.

## 2024-01-01 NOTE — Assessment & Plan Note (Signed)
 BMI 47. Weight: 131 kg (288 lb)  Advised a low-fat/low-sodium diet, advised 150 minutes of scheduled weekly physical activity as tolerated continue with weight loss efforts.

## 2024-01-05 ENCOUNTER — Ambulatory Visit (HOSPITAL_PSYCHIATRIC): Payer: Self-pay | Admitting: Clinical

## 2024-01-06 ENCOUNTER — Other Ambulatory Visit: Payer: Self-pay

## 2024-01-07 ENCOUNTER — Ambulatory Visit: Payer: Self-pay | Admitting: PHYSICIAN ASSISTANT

## 2024-01-11 ENCOUNTER — Other Ambulatory Visit (RURAL_HEALTH_CENTER): Payer: Self-pay | Admitting: Family

## 2024-01-13 ENCOUNTER — Other Ambulatory Visit: Payer: Self-pay

## 2024-01-14 ENCOUNTER — Other Ambulatory Visit: Payer: Self-pay

## 2024-01-14 ENCOUNTER — Ambulatory Visit: Attending: PHYSICIAN ASSISTANT | Admitting: PHYSICIAN ASSISTANT

## 2024-01-14 ENCOUNTER — Encounter (HOSPITAL_PSYCHIATRIC): Payer: Self-pay | Admitting: PHYSICIAN ASSISTANT

## 2024-01-14 VITALS — BP 149/84 | HR 83 | Resp 19 | Ht 65.0 in | Wt 288.0 lb

## 2024-01-14 DIAGNOSIS — Z634 Disappearance and death of family member: Secondary | ICD-10-CM | POA: Insufficient documentation

## 2024-01-14 DIAGNOSIS — F4310 Post-traumatic stress disorder, unspecified: Secondary | ICD-10-CM | POA: Insufficient documentation

## 2024-01-14 DIAGNOSIS — F5104 Psychophysiologic insomnia: Secondary | ICD-10-CM | POA: Insufficient documentation

## 2024-01-14 DIAGNOSIS — F329 Major depressive disorder, single episode, unspecified: Secondary | ICD-10-CM | POA: Insufficient documentation

## 2024-01-14 DIAGNOSIS — F411 Generalized anxiety disorder: Secondary | ICD-10-CM | POA: Insufficient documentation

## 2024-01-14 DIAGNOSIS — G4700 Insomnia, unspecified: Secondary | ICD-10-CM

## 2024-01-14 DIAGNOSIS — F410 Panic disorder [episodic paroxysmal anxiety] without agoraphobia: Secondary | ICD-10-CM | POA: Insufficient documentation

## 2024-01-14 MED ORDER — LORAZEPAM 1 MG TABLET
ORAL_TABLET | ORAL | 1 refills | Status: AC
Start: 2024-01-14 — End: ?

## 2024-01-14 MED ORDER — ARIPIPRAZOLE 5 MG TABLET: 5.0000 mg | ORAL_TABLET | Freq: Every day | ORAL | 1 refills | Status: DC

## 2024-01-14 MED ORDER — CITALOPRAM 40 MG TABLET
40.0000 mg | ORAL_TABLET | Freq: Every day | ORAL | 1 refills | Status: DC
Start: 2024-01-14 — End: 2024-02-11

## 2024-01-14 MED ORDER — ZOLPIDEM 10 MG TABLET: 10.0000 mg | ORAL_TABLET | Freq: Every evening | ORAL | 1 refills | Status: DC

## 2024-01-14 MED ORDER — PRAZOSIN 1 MG CAPSULE: 1.0000 mg | ORAL_CAPSULE | Freq: Every evening | ORAL | 1 refills | Status: DC

## 2024-01-14 NOTE — Patient Instructions (Signed)
 Take medications as prescribed, avoid drugs and alcohol, call office if symptoms worsen or problems arise.  [phone].
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
## 2024-01-16 ENCOUNTER — Ambulatory Visit (HOSPITAL_PSYCHIATRIC): Payer: Self-pay | Admitting: Clinical

## 2024-02-02 ENCOUNTER — Other Ambulatory Visit (RURAL_HEALTH_CENTER): Payer: Self-pay | Admitting: Family Medicine

## 2024-02-02 DIAGNOSIS — Z1231 Encounter for screening mammogram for malignant neoplasm of breast: Secondary | ICD-10-CM

## 2024-02-10 ENCOUNTER — Other Ambulatory Visit: Payer: Self-pay

## 2024-02-11 ENCOUNTER — Encounter (HOSPITAL_PSYCHIATRIC): Payer: Self-pay | Admitting: PHYSICIAN ASSISTANT

## 2024-02-11 ENCOUNTER — Ambulatory Visit: Payer: Self-pay | Attending: PHYSICIAN ASSISTANT | Admitting: PHYSICIAN ASSISTANT

## 2024-02-11 VITALS — Resp 18 | Ht 65.0 in | Wt 294.0 lb

## 2024-02-11 DIAGNOSIS — F4310 Post-traumatic stress disorder, unspecified: Secondary | ICD-10-CM | POA: Insufficient documentation

## 2024-02-11 DIAGNOSIS — Z634 Disappearance and death of family member: Secondary | ICD-10-CM | POA: Insufficient documentation

## 2024-02-11 DIAGNOSIS — F5104 Psychophysiologic insomnia: Secondary | ICD-10-CM | POA: Insufficient documentation

## 2024-02-11 DIAGNOSIS — F410 Panic disorder [episodic paroxysmal anxiety] without agoraphobia: Secondary | ICD-10-CM | POA: Insufficient documentation

## 2024-02-11 DIAGNOSIS — F411 Generalized anxiety disorder: Secondary | ICD-10-CM | POA: Insufficient documentation

## 2024-02-11 DIAGNOSIS — F329 Major depressive disorder, single episode, unspecified: Secondary | ICD-10-CM | POA: Insufficient documentation

## 2024-02-11 DIAGNOSIS — G4700 Insomnia, unspecified: Secondary | ICD-10-CM

## 2024-02-11 MED ORDER — LACTULOSE 10 GRAM/15 ML ORAL SOLUTION: 30.0000 mL | Freq: Every day | ORAL | 0 refills | Status: DC

## 2024-02-11 MED ORDER — DULOXETINE 30 MG CAPSULE,DELAYED RELEASE: 30.0000 mg | DELAYED_RELEASE_CAPSULE | Freq: Every day | ORAL | 0 refills | Status: DC

## 2024-02-11 MED ORDER — BACLOFEN 10 MG TABLET: 10.0000 mg | ORAL_TABLET | Freq: Three times a day (TID) | ORAL | 0 refills | Status: DC

## 2024-02-11 NOTE — Progress Notes (Signed)
 [facility]  [address]  [place] [address]  Operated by [facility]  Progress Note    Name: Lindsey Daugherty MRN:  [id number]   Date: 02/11/2024 DOB:  1975/04/30 (48 y.o.)           Chief Complaint: PTSD, Panic Disorder, Insomnia, GAD, Bereavement, and Major Depression    Subjective:   Patient reports that her father's death anniversary is in 04/07/24. The holidays are not good for her. She changed PCP's. The new PCP started her on Iron every other day, and started OTC Omega 3 gummies. She completed a Media planner. She feels the patient may have Fibromyalgia. She wanted to start Neurontin, but the patient asked for Flexeril instead, but it made her extremely sleepy. The muscles stopped hurting, but the joints started hurting. She is missing her fiance and is worried that the same thing is going to happen to her as it did for him with a sudden cardiac event. She overthinks things.  Mood: anxious.  Medications: Discussed changing Celexa  to Cymbalta for better control of her depression. Can add Baclofen for anxiety/muscle spasms.  Appetite: Up and down.   Sleep: She goes to sleep, but is waking up a lot with the Ambien . She has to get up usually to go to the restroom.   Energy: Up and down.   Stressors Holidays.    Problem List[1]  Past Medical History[2]  Past Surgical History[3]  Family History[4]  Social History     Socioeconomic History    Marital status: Divorced    Number of children: 0    Highest education level: Associate degree: academic program   Occupational History    Occupation: Pharmaceutical   Tobacco Use    Smoking status: Every Day     Current packs/day: 0.50     Average packs/day: 0.5 packs/day for 30.8 years (15.4 ttl pk-yrs)     Types: Cigarettes     Start date: 1995    Smokeless tobacco: Never    Tobacco comments:     Declines cessation   Vaping Use    Vaping status: Never Used   Substance and Sexual Activity    Alcohol use: Never     Drug use: Never    Sexual activity: Not Currently     Partners: Male     Comment: LMP: 12/04/21 ON ORAL CONTRACEPTION; G2P0A2     Social Determinants of Health     Financial Resource Strain: Low Risk (11/25/2022)    Financial Resource Strain     SDOH Financial: No   Transportation Needs: Low Risk (11/25/2022)    Transportation Needs     SDOH Transportation: No   Social Connections: Low Risk (11/25/2022)    Social Connections     SDOH Social Isolation: 5 or more times a week   Intimate Partner Violence: Low Risk (11/25/2022)    Intimate Partner Violence     SDOH Domestic Violence: No   Housing Stability: Low Risk (11/25/2022)    Housing Stability     SDOH Housing Situation: I have housing.     SDOH Housing Worry: No      Loratadine, Pseudoephedrine, Sulfamethoxazole-trimethoprim, Vitamin e, Vitamin e-safflower oil, and Vitamin e (d-alpha tocopherol)   Current Outpatient Medications   Medication Sig    ARIPiprazole  (ABILIFY ) 5 mg Oral Tablet Take 1 Tablet (5 mg total) by mouth Daily    ascorbic acid (SUPER C PO) Take 1 Tablet by mouth Once a day Super c  with zinc    citalopram  (CELEXA ) 40 mg Oral Tablet Take 1 Tablet (40 mg total) by mouth Daily    cyclobenzaprine (FLEXERIL) 5 mg Oral Tablet Take 1 Tablet (5 mg total) by mouth Three times a day    drospirenone, contraceptive, (SLYND) 4 mg (28) Oral Tablet Take 1 Tablet (4 mg total) by mouth Daily    ferrous sulfate (SLOW FE ORAL) Take 1 Each by mouth Daily    furosemide (LASIX) 20 mg Oral Tablet Take 1 Tablet (20 mg total) by mouth Once per day as needed    gabapentin (NEURONTIN) 300 mg Oral Capsule Take 1 Capsule (300 mg total) by mouth Daily    Ibuprofen  (MOTRIN ) 800 mg Oral Tablet Take 1 Tablet (800 mg total) by mouth Three times a day as needed for Pain    levothyroxine  (SYNTHROID) 200 mcg Oral Tablet Take 1 Tablet (200 mcg total) by mouth Every morning for 90 days    LORazepam  (ATIVAN ) 1 mg Oral Tablet Take one tablet by mouth every morning and then twice daily as needed  for anxiety/panic attacks.    lovastatin  (MEVACOR ) 20 mg Oral Tablet Take 1 Tablet (20 mg total) by mouth Every evening with dinner    pantoprazole  (PROTONIX ) 40 mg Oral Tablet, Delayed Release (E.C.) Take 1 Tablet (40 mg total) by mouth Once a day for 90 days    prazosin  (MINIPRESS ) 1 mg Oral Capsule Take 1 Capsule (1 mg total) by mouth Every evening    prenatal 21/iron fu/folic acid (PRENATAL COMPLETE ORAL) Take 1 Tablet by mouth Once a day (Patient not taking: Reported on 01/14/2024)    valACYclovir (VALTREX) 500 mg Oral Tablet Take 1 Tablet (500 mg total) by mouth Daily    VITAMIN D  1,250 mcg (50,000 unit) Oral Capsule Take 1 Capsule (50,000 Units total) by mouth Every 7 days    zolpidem  (AMBIEN ) 10 mg Oral Tablet Take 1 Tablet (10 mg total) by mouth Every night      Objective :  Resp 18   Ht 1.651 m (5' 5)   Wt 133 kg (294 lb)   BMI 48.92 kg/m       PHQ Total Score  PHQ 2 Total: 6  PHQ 9 Total: 24  Interpretation of Total Score: 20-27 Severe depression             12/10/2023    10:18 AM 01/14/2024    10:07 AM 02/11/2024    11:37 AM   Most Recent PHQ-9 Scores   PHQ 9 Total 27 24 24          Mental Status Exam  AXOX4. Casual dress, calm, well-groomed. No SI, HI, AVH, delusions, or paranoia. Thoughts are logical, coherent, and goal-directed. Good eye contact. Speech is normal in rate and tone. Mood is okay affect congruent. No psychomotor agitation or retardation, cogwheel rigidity, or abnormal movements. Gait is normal. Attention, concentration, and memory are good. No cognitive deficits noted. Judgment and insight are fair. Calculation and abstraction are within normal limits.     Data Reviewed  I have reviewed patient's previous note medical, surgical, family, and social history in detail today,     Assessment  PTSD, Panic Disorder, Insomnia, GAD, Bereavement, and Major Depression    Plan  Continue the following medications:  Discontinue Celexa , changing to Cymbalta.  Start Cymbalta 30 mg daily for  mood.  Abilify  5 mg daily for mood.  Ativan  1 mg daily and then bid/prn for anxiety.  Ambien  10 mg qhs/prn  for sleep.   Prazosin  1 mg qhs/prn for sleep.  Lactulose 30 ml daily for constipation.  Baclofen 10 mg bid for anxiety/muscle spasm.  She starts counseling tomorrow with Heather.    Follow up  Return to clinic in 4 weeks.    Adrien Herd, PA-C          [1]   Patient Active Problem List  Diagnosis    Acquired hypothyroidism    Tobacco dependence due to cigarettes    Gastroesophageal reflux disease without esophagitis    Insomnia    Vitamin D  deficiency    B12 deficiency    Morbid obesity with BMI of 45.0-49.9, adult (CMS HCC)    Hypertriglyceridemia    Bereavement    Environmental and seasonal allergies    Major depression    Generalized anxiety disorder with panic attacks    Panic disorder without agoraphobia    Mild sleep apnea    Normal gynecologic examination    Nontoxic single thyroid  nodule    Noninflammatory disorder of vagina    Non-toxic multinodular goiter    Need for immunization against influenza    Menorrhagia    Benign neoplasm of thyroid  gland    Back pain    Chronic joint pain    Iron deficiency anemia    Abnormal findings on diagnostic imaging of other specified body structures    Weight loss advised    Encounter for weight management    PTSD (post-traumatic stress disorder)    Encounter for adult wellness visit    Diabetes mellitus screening    Colon cancer screening    Right otitis media   [2]   Past Medical History:  Diagnosis Date    Acute vaginitis 08/23/2022    Bereavement 05/08/2022    Dermatitis, unspecified 08/23/2022    Dizziness 08/23/2022    Dizziness and giddiness 08/23/2022    Dysmenorrhea 08/23/2022    Edema of lower extremity 07/24/2021    Esophageal reflux     Family history of cardiovascular disease 08/23/2022    Family history of malignant neoplasm of digestive organ 08/23/2022    Insomnia     Lightheadedness 08/23/2022    Migraine     Snoring 08/23/2022    Thyroid  cancer (CMS  HCC) 2018    Vaginal odor 08/23/2022   [3]   Past Surgical History:  Procedure Laterality Date    HX CHOLECYSTECTOMY  02/20/2022    HX PARTIAL THYROIDECTOMY Right 2018    No chemo/radiation; dx thyroid  cancer    HX PARTIAL THYROIDECTOMY Left 2020    no chemo/radiation    HX TONSILLECTOMY  1981   [4]   Family History  Problem Relation Name Age of Onset    No Known Problems Mother      Diabetes type II Father      Hypertension (High Blood Pressure) Father      Elevated Lipids Father      No Known Problems Sister      No Known Problems Brother      No Known Problems Half-Sister      No Known Problems Half-Brother      No Known Problems Maternal Aunt      No Known Problems Maternal Uncle      No Known Problems Paternal Aunt      No Known Problems Paternal Uncle      No Known Problems Maternal Grandmother      No Known Problems Maternal Grandfather      No  Known Problems Paternal Grandmother      No Known Problems Paternal Grandfather      No Known Problems Daughter      No Known Problems Son

## 2024-02-11 NOTE — Patient Instructions (Signed)
 Take medications as prescribed, avoid drugs and alcohol, call office if symptoms worsen or problems arise.  [phone].     Stop Celexa , Flexeril.    Start Cymbalta and Baclofen.    Start  Lactulose.

## 2024-02-20 ENCOUNTER — Other Ambulatory Visit (HOSPITAL_PSYCHIATRIC): Payer: Self-pay | Admitting: PHYSICIAN ASSISTANT

## 2024-02-20 NOTE — Telephone Encounter (Signed)
 Refill too soon. Sent to pharmacy on 10/17/23 90 day supply and 1 attached

## 2024-03-10 ENCOUNTER — Ambulatory Visit (HOSPITAL_PSYCHIATRIC): Payer: Self-pay | Admitting: Clinical

## 2024-03-10 ENCOUNTER — Ambulatory Visit: Payer: Self-pay | Admitting: PHYSICIAN ASSISTANT

## 2024-03-22 ENCOUNTER — Other Ambulatory Visit: Payer: Self-pay

## 2024-03-23 ENCOUNTER — Encounter (HOSPITAL_PSYCHIATRIC): Payer: Self-pay | Admitting: PHYSICIAN ASSISTANT

## 2024-03-23 ENCOUNTER — Ambulatory Visit: Payer: Self-pay | Admitting: PHYSICIAN ASSISTANT

## 2024-03-23 VITALS — BP 130/89 | HR 89 | Resp 18 | Ht 65.0 in | Wt 285.0 lb

## 2024-03-23 DIAGNOSIS — F4310 Post-traumatic stress disorder, unspecified: Secondary | ICD-10-CM

## 2024-03-23 DIAGNOSIS — Z79899 Other long term (current) drug therapy: Secondary | ICD-10-CM

## 2024-03-23 DIAGNOSIS — F410 Panic disorder [episodic paroxysmal anxiety] without agoraphobia: Secondary | ICD-10-CM

## 2024-03-23 DIAGNOSIS — Z634 Disappearance and death of family member: Secondary | ICD-10-CM

## 2024-03-23 DIAGNOSIS — F329 Major depressive disorder, single episode, unspecified: Secondary | ICD-10-CM

## 2024-03-23 DIAGNOSIS — F411 Generalized anxiety disorder: Secondary | ICD-10-CM

## 2024-03-23 MED ORDER — ZOLPIDEM 10 MG TABLET: 10.0000 mg | ORAL_TABLET | Freq: Every evening | ORAL | 2 refills | Status: AC

## 2024-03-23 MED ORDER — DULOXETINE 30 MG CAPSULE,DELAYED RELEASE: 30.0000 mg | DELAYED_RELEASE_CAPSULE | Freq: Every day | ORAL | 1 refills | Status: DC

## 2024-03-23 MED ORDER — LACTULOSE 10 GRAM/15 ML ORAL SOLUTION: 30.0000 mL | Freq: Every day | ORAL | 2 refills | Status: AC

## 2024-03-23 MED ORDER — BACLOFEN 10 MG TABLET: 10.0000 mg | ORAL_TABLET | Freq: Three times a day (TID) | ORAL | 2 refills | Status: AC

## 2024-03-23 MED ORDER — ARIPIPRAZOLE 5 MG TABLET: 5.0000 mg | ORAL_TABLET | Freq: Every day | ORAL | 1 refills | Status: DC

## 2024-03-23 MED ORDER — PRAZOSIN 1 MG CAPSULE: 1.0000 mg | ORAL_CAPSULE | Freq: Every evening | ORAL | 1 refills | Status: DC

## 2024-03-23 NOTE — Progress Notes (Addendum)
 [facility], [facility]  [address]  [place] [address]  Operated by [facility]  Progress Note    Name: Lindsey Daugherty MRN:  [id number]   Date: 03/23/2024 DOB:  03/05/1976 (48 y.o.)           Chief Complaint: Major Depression, Generalized Anxiety, Panic Disorder, PTSD, Bereavement    Subjective:   Patient reports she went to her brother's house in [place], [place]  and her other brother came in from [place]. Friday was difficult, because it was the anniversary of her father's death. They pushed through it together.  They haven't made any plans for Christmas. She has to work on Christmas Eve. She thinks that she will spend part of Christmas with the family of her late boyfriend. 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 She is getting involved in her church. She feels that the depression is better and the panic attacks are getting milder and less frequent.   Mood: :not too bad.  Medications Feels like they are working pretty good. The Baclofen  still makes her a little sleepy, but not as bad as the Flexeril.   Appetite: She is eating well with a good appetite. She struggles with what she eats, because she is alone, she finds it difficult to cook a meal for one.  Sleep: Sleeping well, but every morning she wakes up with her arms tucked her legs.   Energy: Not keeping up and she has low motivation.   Stressors: Anniversary of her father's death, the holidays, finances.    Problem List[1]  Past Medical History[2]  Past Surgical History[3]  Family History[4]  Social History     Socioeconomic History    Marital status: Divorced    Number of children: 0    Highest education level: Associate degree: academic program   Occupational History    Occupation: Pharmaceutical   Tobacco Use    Smoking status: Every Day     Current packs/day: 0.50     Average packs/day: 0.5 packs/day for 30.9 years (15.5 ttl pk-yrs)     Types:  Cigarettes     Start date: 1995    Smokeless tobacco: Never    Tobacco comments:     Declines cessation   Vaping Use    Vaping status: Never Used   Substance and Sexual Activity    Alcohol use: Never    Drug use: Never    Sexual activity: Not Currently     Partners: Male     Comment: LMP: 12/04/21 ON ORAL CONTRACEPTION; [id number]     Social Determinants of Health     Financial Resource Strain: Low Risk (11/25/2022)    Financial Resource Strain     SDOH Financial: No   Transportation Needs: Low Risk (11/25/2022)    Transportation Needs     SDOH Transportation: No   Social Connections: Low Risk (11/25/2022)    Social Connections     SDOH Social Isolation: 5 or more times a week   Intimate Partner Violence: Low Risk (11/25/2022)    Intimate Partner Violence     SDOH Domestic Violence: No   Housing Stability: Low Risk (11/25/2022)    Housing Stability     SDOH Housing Situation: I have housing.     SDOH Housing Worry: No      Loratadine, Pseudoephedrine, Sulfamethoxazole-trimethoprim, Vitamin e, Vitamin e-safflower oil, and Vitamin e (d-alpha tocopherol)   Current Outpatient Medications   Medication  Sig    ARIPiprazole  (ABILIFY ) 5 mg Oral Tablet Take 1 Tablet (5 mg total) by mouth Daily    ascorbic acid (SUPER C PO) Take 1 Tablet by mouth Once a day Super c with zinc    Baclofen  (LIORESAL ) 10 mg Oral Tablet Take 1 Tablet (10 mg total) by mouth Three times a day    drospirenone, contraceptive, (SLYND) 4 mg (28) Oral Tablet Take 1 Tablet (4 mg total) by mouth Daily    DULoxetine  (CYMBALTA ) 30 mg Oral Capsule, Delayed Release(E.C.) Take 1 Capsule (30 mg total) by mouth Daily    ferrous sulfate (SLOW FE ORAL) Take 1 Each by mouth Daily    furosemide (LASIX) 20 mg Oral Tablet Take 1 Tablet (20 mg total) by mouth Once per day as needed    Ibuprofen  (MOTRIN ) 800 mg Oral Tablet Take 1 Tablet (800 mg total) by mouth Three times a day as needed for Pain    lactulose  (ENULOSE ) 10 gram/15 mL Oral Solution Take 30 mL by mouth Daily     levothyroxine  (SYNTHROID) 200 mcg Oral Tablet Take 1 Tablet (200 mcg total) by mouth Every morning for 90 days    LORazepam  (ATIVAN ) 1 mg Oral Tablet Take one tablet by mouth every morning and then twice daily as needed for anxiety/panic attacks.    lovastatin  (MEVACOR ) 20 mg Oral Tablet Take 1 Tablet (20 mg total) by mouth Every evening with dinner    pantoprazole  (PROTONIX ) 40 mg Oral Tablet, Delayed Release (E.C.) Take 1 Tablet (40 mg total) by mouth Once a day for 90 days    prazosin  (MINIPRESS ) 1 mg Oral Capsule Take 1 Capsule (1 mg total) by mouth Every evening    prenatal 21/iron fu/folic acid (PRENATAL COMPLETE ORAL) Take 1 Tablet by mouth Once a day (Patient not taking: Reported on 01/14/2024)    valACYclovir (VALTREX) 500 mg Oral Tablet Take 1 Tablet (500 mg total) by mouth Daily    VITAMIN D  1,250 mcg (50,000 unit) Oral Capsule Take 1 Capsule (50,000 Units total) by mouth Every 7 days    zolpidem  (AMBIEN ) 10 mg Oral Tablet Take 1 Tablet (10 mg total) by mouth Every night      Objective :  BP 130/89 (Site: Left Arm, Patient Position: Sitting)   Pulse 89   Resp 18   Ht 1.651 m (5' 5)   Wt 129 kg (285 lb)   BMI 47.43 kg/m       PHQ Total Score  PHQ 2 Total: 6  PHQ 9 Total: 20  Interpretation of Total Score: 20-27 Severe depression             01/14/2024    10:07 AM 02/11/2024    11:37 AM 03/23/2024    11:01 AM   Most Recent PHQ-9 Scores   PHQ 9 Total 24 24 20          Mental Status Exam  AXOX4. Casual dress, calm, well-groomed. No SI, HI, AVH, delusions, or paranoia. Thoughts are logical, coherent, and goal-directed. Good eye contact. Speech is normal in rate and tone. Mood is not too bad affect congruent. No psychomotor agitation or retardation, cogwheel rigidity, or abnormal movements. Gait is normal. Attention, concentration, and memory are good. No cognitive deficits noted. Judgment and insight are fair. Calculation and abstraction are within normal limits.     Data Reviewed  I have reviewed  patient's previous note medical, surgical, family, and social history in detail today,     Assessment  Major  Depression, Generalized Anxiety, Panic Disorder, PTSD, Bereavement    Plan  Continue the following medications:  Cymbalta  30 mg daily for mood.  Abilify  5 mg daily for mood.  Ativan  1 mg daily and then bid/prn for anxiety.  Ambien  10 mg qhs/prn for sleep.   Prazosin  1 mg qhs/prn for sleep.  Lactulose  30 ml daily for constipation.  Baclofen  10 mg bid for anxiety/muscle spasm.  She is scheduled to see Heather in January.    Follow up  Return to clinic in 2 months.    Adrien Herd, PA-C          [1]   Patient Active Problem List  Diagnosis    Acquired hypothyroidism    Tobacco dependence due to cigarettes    Gastroesophageal reflux disease without esophagitis    Insomnia    Vitamin D  deficiency    B12 deficiency    Morbid obesity with BMI of 45.0-49.9, adult (CMS HCC)    Hypertriglyceridemia    Bereavement    Environmental and seasonal allergies    Major depression    Generalized anxiety disorder with panic attacks    Panic disorder without agoraphobia    Mild sleep apnea    Normal gynecologic examination    Nontoxic single thyroid  nodule    Noninflammatory disorder of vagina    Non-toxic multinodular goiter    Need for immunization against influenza    Menorrhagia    Benign neoplasm of thyroid  gland    Back pain    Chronic joint pain    Iron deficiency anemia    Abnormal findings on diagnostic imaging of other specified body structures    Weight loss advised    Encounter for weight management    PTSD (post-traumatic stress disorder)    Encounter for adult wellness visit    Diabetes mellitus screening    Colon cancer screening    Right otitis media   [2]   Past Medical History:  Diagnosis Date    Acute vaginitis 08/23/2022    Bereavement 05/08/2022    Dermatitis, unspecified 08/23/2022    Dizziness 08/23/2022    Dizziness and giddiness 08/23/2022    Dysmenorrhea 08/23/2022    Edema of lower extremity 07/24/2021     Esophageal reflux     Family history of cardiovascular disease 08/23/2022    Family history of malignant neoplasm of digestive organ 08/23/2022    Insomnia     Lightheadedness 08/23/2022    Migraine     Snoring 08/23/2022    Thyroid  cancer (CMS HCC) 2018    Vaginal odor 08/23/2022   [3]   Past Surgical History:  Procedure Laterality Date    HX CHOLECYSTECTOMY  02/20/2022    HX PARTIAL THYROIDECTOMY Right 2018    No chemo/radiation; dx thyroid  cancer    HX PARTIAL THYROIDECTOMY Left 2020    no chemo/radiation    HX TONSILLECTOMY  1981   [4]   Family History  Problem Relation Name Age of Onset    No Known Problems Mother      Diabetes type II Father      Hypertension (High Blood Pressure) Father      Elevated Lipids Father      No Known Problems Sister      No Known Problems Brother      No Known Problems Half-Sister      No Known Problems Half-Brother      No Known Problems Maternal Aunt      No Known Problems Maternal  Uncle      No Known Problems Paternal Aunt      No Known Problems Paternal Uncle      No Known Problems Maternal Grandmother      No Known Problems Maternal Grandfather      No Known Problems Paternal Grandmother      No Known Problems Paternal Grandfather      No Known Problems Daughter      No Known Problems Son

## 2024-03-23 NOTE — Patient Instructions (Signed)
 Take medications as prescribed, avoid drugs and alcohol, call office if symptoms worsen or problems arise.  [phone].

## 2024-04-07 ENCOUNTER — Ambulatory Visit (RURAL_HEALTH_CENTER): Payer: Self-pay | Admitting: Family

## 2024-04-30 ENCOUNTER — Ambulatory Visit (HOSPITAL_PSYCHIATRIC): Payer: Self-pay | Admitting: Clinical

## 2024-05-10 ENCOUNTER — Encounter (HOSPITAL_COMMUNITY): Payer: Self-pay | Admitting: SLEEP MEDICINE

## 2024-05-10 ENCOUNTER — Ambulatory Visit: Payer: Self-pay | Attending: SLEEP MEDICINE | Admitting: SLEEP MEDICINE

## 2024-05-10 ENCOUNTER — Other Ambulatory Visit: Payer: Self-pay

## 2024-05-10 VITALS — BP 154/65 | HR 81 | Temp 99.1°F | Resp 18 | Ht 65.0 in | Wt 303.0 lb

## 2024-05-10 DIAGNOSIS — F419 Anxiety disorder, unspecified: Secondary | ICD-10-CM | POA: Insufficient documentation

## 2024-05-10 DIAGNOSIS — J4520 Mild intermittent asthma, uncomplicated: Secondary | ICD-10-CM | POA: Insufficient documentation

## 2024-05-10 DIAGNOSIS — G4700 Insomnia, unspecified: Secondary | ICD-10-CM | POA: Insufficient documentation

## 2024-05-10 DIAGNOSIS — F17210 Nicotine dependence, cigarettes, uncomplicated: Secondary | ICD-10-CM

## 2024-05-10 DIAGNOSIS — R0683 Snoring: Secondary | ICD-10-CM | POA: Insufficient documentation

## 2024-05-10 DIAGNOSIS — R002 Palpitations: Secondary | ICD-10-CM | POA: Insufficient documentation

## 2024-05-10 DIAGNOSIS — R011 Cardiac murmur, unspecified: Secondary | ICD-10-CM | POA: Insufficient documentation

## 2024-05-10 DIAGNOSIS — R0600 Dyspnea, unspecified: Secondary | ICD-10-CM | POA: Insufficient documentation

## 2024-05-10 DIAGNOSIS — G4733 Obstructive sleep apnea (adult) (pediatric): Secondary | ICD-10-CM | POA: Insufficient documentation

## 2024-05-24 ENCOUNTER — Ambulatory Visit (HOSPITAL_COMMUNITY): Payer: Self-pay

## 2024-05-24 ENCOUNTER — Ambulatory Visit: Payer: Self-pay | Admitting: PHYSICIAN ASSISTANT

## 2024-05-27 ENCOUNTER — Ambulatory Visit (HOSPITAL_COMMUNITY)

## 2024-05-31 ENCOUNTER — Ambulatory Visit: Admitting: PHYSICIAN ASSISTANT

## 2024-06-07 ENCOUNTER — Ambulatory Visit (HOSPITAL_COMMUNITY)

## 2024-06-21 ENCOUNTER — Ambulatory Visit (HOSPITAL_COMMUNITY): Payer: Self-pay

## 2024-06-21 ENCOUNTER — Ambulatory Visit

## 2024-10-04 ENCOUNTER — Ambulatory Visit (RURAL_HEALTH_CENTER): Payer: Self-pay | Admitting: Family
